# Patient Record
Sex: Male | Born: 1945 | Race: Black or African American | Hispanic: No | Marital: Married | Smoking: Former smoker
Health system: Southern US, Community
[De-identification: ages and names within clinical notes are randomized; demographics above are authoritative.]

## PROBLEM LIST (undated history)

## (undated) DIAGNOSIS — J40 Bronchitis, not specified as acute or chronic: Secondary | ICD-10-CM

## (undated) DIAGNOSIS — I509 Heart failure, unspecified: Secondary | ICD-10-CM

## (undated) DIAGNOSIS — I10 Essential (primary) hypertension: Secondary | ICD-10-CM

## (undated) DIAGNOSIS — E7800 Pure hypercholesterolemia, unspecified: Secondary | ICD-10-CM

## (undated) DIAGNOSIS — R0602 Shortness of breath: Secondary | ICD-10-CM

## (undated) DIAGNOSIS — J449 Chronic obstructive pulmonary disease, unspecified: Secondary | ICD-10-CM

## (undated) HISTORY — PX: NO PAST SURGERIES: SHX2092

---

## 2001-06-21 ENCOUNTER — Emergency Department (HOSPITAL_COMMUNITY): Admission: EM | Admit: 2001-06-21 | Discharge: 2001-06-21 | Payer: Self-pay | Admitting: Emergency Medicine

## 2012-06-09 ENCOUNTER — Emergency Department (HOSPITAL_COMMUNITY)
Admission: EM | Admit: 2012-06-09 | Discharge: 2012-06-09 | Disposition: A | Discharge status: Home / Self Care | Payer: 59 | Attending: Emergency Medicine | Admitting: Emergency Medicine

## 2012-06-09 ENCOUNTER — Emergency Department (HOSPITAL_COMMUNITY): Payer: 59

## 2012-06-09 ENCOUNTER — Encounter (HOSPITAL_COMMUNITY): Payer: Self-pay | Admitting: *Deleted

## 2012-06-09 DIAGNOSIS — I10 Essential (primary) hypertension: Secondary | ICD-10-CM | POA: Insufficient documentation

## 2012-06-09 DIAGNOSIS — R0989 Other specified symptoms and signs involving the circulatory and respiratory systems: Secondary | ICD-10-CM | POA: Insufficient documentation

## 2012-06-09 DIAGNOSIS — J3489 Other specified disorders of nose and nasal sinuses: Secondary | ICD-10-CM | POA: Insufficient documentation

## 2012-06-09 DIAGNOSIS — R059 Cough, unspecified: Secondary | ICD-10-CM | POA: Insufficient documentation

## 2012-06-09 DIAGNOSIS — R05 Cough: Secondary | ICD-10-CM | POA: Insufficient documentation

## 2012-06-09 DIAGNOSIS — Z8709 Personal history of other diseases of the respiratory system: Secondary | ICD-10-CM | POA: Insufficient documentation

## 2012-06-09 DIAGNOSIS — R0609 Other forms of dyspnea: Secondary | ICD-10-CM | POA: Insufficient documentation

## 2012-06-09 DIAGNOSIS — Z79899 Other long term (current) drug therapy: Secondary | ICD-10-CM | POA: Insufficient documentation

## 2012-06-09 DIAGNOSIS — R197 Diarrhea, unspecified: Secondary | ICD-10-CM | POA: Insufficient documentation

## 2012-06-09 DIAGNOSIS — J189 Pneumonia, unspecified organism: Secondary | ICD-10-CM

## 2012-06-09 DIAGNOSIS — F17200 Nicotine dependence, unspecified, uncomplicated: Secondary | ICD-10-CM | POA: Insufficient documentation

## 2012-06-09 HISTORY — DX: Essential (primary) hypertension: I10

## 2012-06-09 HISTORY — DX: Bronchitis, not specified as acute or chronic: J40

## 2012-06-09 LAB — BASIC METABOLIC PANEL
BUN: 8 mg/dL (ref 6–23)
Calcium: 8.6 mg/dL (ref 8.4–10.5)
GFR calc non Af Amer: >90 mL/min (ref >90)
Glucose, Bld: 86 mg/dL (ref 70–99)
Sodium: 140 mEq/L (ref 135–145)

## 2012-06-09 LAB — CBC WITH DIFFERENTIAL/PLATELET
Basophils Relative: 2 % — ABNORMAL HIGH (ref 0–1)
Eosinophils Absolute: 0 10*3/uL (ref 0.0–0.7)
Hemoglobin: 12.7 g/dL — ABNORMAL LOW (ref 13.0–17.0)
MCH: 26.8 pg (ref 26.0–34.0)
MCHC: 32.5 g/dL (ref 30.0–36.0)
Monocytes Absolute: 0.3 10*3/uL (ref 0.1–1.0)
Monocytes Relative: 7 % (ref 3–12)
Neutrophils Relative %: 58 % (ref 43–77)
RDW: 15 % (ref 11.5–15.5)

## 2012-06-09 MED ORDER — PREDNISONE 50 MG PO TABS
60.0000 mg | ORAL_TABLET | Freq: Once | ORAL | Status: AC
  Administered 2012-06-09: 60 mg via ORAL
  Filled 2012-06-09: qty 1

## 2012-06-09 MED ORDER — LEVOFLOXACIN 750 MG PO TABS: 750.0000 mg | ORAL_TABLET | Freq: Every day | ORAL | Status: DC

## 2012-06-09 MED ORDER — PREDNISONE 20 MG PO TABS: ORAL_TABLET | ORAL | Status: DC

## 2012-06-09 MED ORDER — ALBUTEROL SULFATE (5 MG/ML) 0.5% IN NEBU
5.0000 mg | INHALATION_SOLUTION | Freq: Once | RESPIRATORY_TRACT | Status: AC
  Administered 2012-06-09: 5 mg via RESPIRATORY_TRACT
  Filled 2012-06-09: qty 1

## 2012-06-09 MED ORDER — IPRATROPIUM BROMIDE 0.02 % IN SOLN
0.5000 mg | Freq: Once | RESPIRATORY_TRACT | Status: AC
  Administered 2012-06-09: 0.5 mg via RESPIRATORY_TRACT
  Filled 2012-06-09: qty 2.5

## 2012-06-09 NOTE — ED Provider Notes (Signed)
History   This chart was scribed for Ward Givens, MD by Gerlean Ren, ED Scribe. This patient was seen in room APA09/APA09 and the patient's care was started at 12:18 PM    CSN: [id number]  Arrival date & time 06/09/12  1140   First MD Initiated Contact with Patient 06/09/12 1152      Chief Complaint  Patient presents with  . Shortness of Breath    The history is provided by the patient. No language interpreter was used.  Bradley Bates is a 66 y.o. male who presents to the Emergency Department complaining of dyspnea that has been gradually worsening over the past week from baseline dyspnea and states current dyspnea is worst it has ever been.  Pt reports associated mild cough producing yellow phlegm, suspected fever, and occasional diarrhea normal to baseline.  Pt received a breathing treatment this morning at PCP and has had mild clear rhinorrhea since then.  Pt has inhaler that provides temporary relief to current dyspnea.  Pt denies sore throat, emesis, leg swelling.  Pt denies prior hospitalizations for breathing trouble Pt was seen by his PCP last week and started on levaquin on 1/24. He reports he isn't feeling better.   Pt is a current everyday smoker but denies alcohol use.   PCP [facility]  [facility] [place]  Past Medical History  Diagnosis Date  . Hypertension   . Bronchitis     History reviewed. No pertinent past surgical history.  No family history on file.  History  Substance Use Topics  . Smoking status: Current Every Day Smoker    Types: Cigarettes  . Smokeless tobacco: Not on file  . Alcohol Use: No  quit smoking 2 years ago but restarted 1 year ago.  Employed Lives at home Lives with spouse  Review of Systems  HENT: Negative for sore throat.   Respiratory: Positive for cough and shortness of breath.   Cardiovascular: Negative for leg swelling.  Gastrointestinal: Positive for diarrhea. Negative for vomiting.  All other systems reviewed and are  negative.    Allergies  Review of patient's allergies indicates no known allergies.  Home Medications   Current Outpatient Rx  Name  Route  Sig  Dispense  Refill  . ALBUTEROL SULFATE HFA 108 (90 BASE) MCG/ACT IN AERS   Inhalation   Inhale 2 puffs into the lungs every 6 (six) hours as needed. sob         . GUAIFENESIN 100 MG/5ML PO SYRP   Oral   Take 200 mg by mouth 3 (three) times daily as needed. Cough.         . IBUPROFEN 200 MG PO TABS   Oral   Take 200 mg by mouth every 6 (six) hours as needed. Pain         . LEVOFLOXACIN 750 MG PO TABS   Oral   Take 750 mg by mouth daily. 06/06/2012 x 5 days.         Marland Kitchen LEVOFLOXACIN 750 MG PO TABS   Oral   Take 1 tablet (750 mg total) by mouth daily.   5 tablet   0     BP 171/98  Pulse 90  Temp 97.9 F (36.6 C) (Oral)  Resp 20  Ht 5\' 10"  (1.778 m)  Wt 199 lb (90.266 kg)  BMI 28.55 kg/m2  SpO2 97%  Vital signs normal except hypertension   Physical Exam  Nursing note and vitals reviewed. Constitutional: He is oriented to person,  place, and time. He appears well-developed and well-nourished.  Non-toxic appearance. He does not appear ill. No distress.  HENT:  Head: Normocephalic and atraumatic.  Right Ear: External ear normal.  Left Ear: External ear normal.  Nose: Nose normal. No mucosal edema or rhinorrhea.  Mouth/Throat: Oropharynx is clear and moist and mucous membranes are normal. No dental abscesses or uvula swelling.  Eyes: Conjunctivae normal and EOM are normal. Pupils are equal, round, and reactive to light.  Neck: Normal range of motion and full passive range of motion without pain. Neck supple.  Cardiovascular: Normal rate, regular rhythm and normal heart sounds.  Exam reveals no gallop and no friction rub.   No murmur heard. Pulmonary/Chest: Effort normal. No respiratory distress. He has wheezes. He has no rhonchi. He has no rales. He exhibits no tenderness and no crepitus.       Wheezes with quiet  breathing, goes away with deep breathing.  Abdominal: Soft. Normal appearance and bowel sounds are normal. He exhibits no distension. There is no tenderness. There is no rebound and no guarding.  Musculoskeletal: Normal range of motion. He exhibits no edema and no tenderness.       Moves all extremities well.   Neurological: He is alert and oriented to person, place, and time. He has normal strength. No cranial nerve deficit.  Skin: Skin is warm, dry and intact. No rash noted. No erythema. No pallor.  Psychiatric: He has a normal mood and affect. His speech is normal and behavior is normal. His mood appears not anxious.    ED Course  Procedures (including critical care time)   Medications  albuterol (PROVENTIL HFA;VENTOLIN HFA) 108 (90 BASE) MCG/ACT inhaler (not administered)  predniSONE (DELTASONE) tablet 60 mg (60 mg Oral Given 06/09/12 1254)  albuterol (PROVENTIL) (5 MG/ML) 0.5% nebulizer solution 5 mg (5 mg Nebulization Given 06/09/12 1259)  ipratropium (ATROVENT) nebulizer solution 0.5 mg (0.5 mg Nebulization Given 06/09/12 1259)     DIAGNOSTIC STUDIES: Oxygen Saturation is 97% on room air, adequate by my interpretation.    COORDINATION OF CARE: 12:25 PM- Patient informed of clinical course, understands medical decision-making process, and agrees with plan.  1:55 PM- Re-check, pt states his breathing is improved and that he is ready to go home.  Informed pt that chest XR shows possible pneumonia. On exam has Improved air movement, rhonchi no longer present. States he has a new inhaler at home.   Pt ambulated with pulse ox which was 88-93 % on RA and 91% when he returned to his room. Denies feeling more SOB, CP.   14:35 D/W Dr Karilyn Cota, hospitalist, feels since he is asymptomatic with walking can go home and continue the levaquin for 10 days total and start on prednisone.    Results for orders placed during the hospital encounter of 06/09/12  CBC WITH DIFFERENTIAL      Component  Value Range   WBC 3.8 (*) 4.0 - 10.5 K/uL   RBC 4.73  4.22 - 5.81 MIL/uL   Hemoglobin 12.7 (*) 13.0 - 17.0 g/dL   HCT 16.1  09.6 - 04.5 %   MCV 82.7  78.0 - 100.0 fL   MCH 26.8  26.0 - 34.0 pg   MCHC 32.5  30.0 - 36.0 g/dL   RDW 40.9  81.1 - 91.4 %   Platelets 111 (*) 150 - 400 K/uL   Neutrophils Relative 58  43 - 77 %   Neutro Abs 2.2  1.7 - 7.7 K/uL  Lymphocytes Relative 32  12 - 46 %   Lymphs Abs 1.2  0.7 - 4.0 K/uL   Monocytes Relative 7  3 - 12 %   Monocytes Absolute 0.3  0.1 - 1.0 K/uL   Eosinophils Relative 1  0 - 5 %   Eosinophils Absolute 0.0  0.0 - 0.7 K/uL   Basophils Relative 2 (*) 0 - 1 %   Basophils Absolute 0.1  0.0 - 0.1 K/uL   WBC Morphology ATYPICAL LYMPHOCYTES    BASIC METABOLIC PANEL      Component Value Range   Sodium 140  135 - 145 mEq/L   Potassium 3.6  3.5 - 5.1 mEq/L   Chloride 100  96 - 112 mEq/L   CO2 32  19 - 32 mEq/L   Glucose, Bld 86  70 - 99 mg/dL   BUN 8  6 - 23 mg/dL   Creatinine, Ser 0.86  0.50 - 1.35 mg/dL   Calcium 8.6  8.4 - 57.8 mg/dL   GFR calc non Af Amer >90  >90 mL/min   GFR calc Af Amer >90  >90 mL/min    Laboratory interpretation all normal except low total WBC without neutropenia   Dg Chest 2 View  06/09/2012  *RADIOLOGY REPORT*  Clinical Data: Cough and shortness of breath  CHEST - 2 VIEW  Comparison: None.  Findings: Borderline enlarged cardiac silhouette and mediastinal contours.  Ill-defined heterogeneous interstitial opacities within the right mid and lower lung.  There is minimal thickening along the right minor fissure.  There is mild eventration of the bilateral hemidiaphragms.  No definite pleural effusion or pneumothorax.  No acute osseous abnormalities.  IMPRESSION: 1.  Ill-defined interstitial opacity within the right mid and lower lung worrisome for multifocal infection, including atypical etiologies.  A follow-up chest radiograph in 4 to 6 weeks after treatment is recommended to ensure resolution.  2.  Borderline  enlarged cardiac silhouette.  Further evaluation with cardiac echo may be performed as clinically indicated.   Original Report Authenticated By: Tacey Ruiz, MD      1. CAP (community acquired pneumonia)    New Prescriptions   LEVOFLOXACIN (LEVAQUIN) 750 MG TABLET    Take 1 tablet (750 mg total) by mouth daily.   PREDNISONE (DELTASONE) 20 MG TABLET    Take 3 po QD x 2d starting tomorrow, then 2 po QD x 3d then 1 po QD x 3d   Plan discharge  Devoria Albe, MD, FACEP    MDM   I personally performed the services described in this documentation, which was scribed in my presence. The recorded information has been reviewed and considered.  Devoria Albe, MD, Armando Gang        Ward Givens, MD 06/09/12 [phone]

## 2012-06-09 NOTE — ED Notes (Signed)
Increased sob x 1 wk.  Seen pcp last week, given abx.  Went back to pcp today with no relief.  PCP sent pt here.  C/o cough, difficulty breathing, worsening when lying flat.

## 2012-06-09 NOTE — ED Notes (Signed)
Pt pulse ox 88%-93% while ambulating around nurses's station. 91% upon return to room. Denies cp/sob/fatigue.

## 2012-12-15 ENCOUNTER — Emergency Department (HOSPITAL_COMMUNITY): Payer: 59

## 2012-12-15 ENCOUNTER — Encounter (HOSPITAL_COMMUNITY): Payer: Self-pay | Admitting: *Deleted

## 2012-12-15 ENCOUNTER — Inpatient Hospital Stay (HOSPITAL_COMMUNITY)
Admission: EM | Admit: 2012-12-15 | Discharge: 2012-12-19 | DRG: 291 | Disposition: A | Discharge status: Home Health | Payer: 59 | Attending: Family Medicine | Admitting: Family Medicine

## 2012-12-15 ENCOUNTER — Other Ambulatory Visit: Payer: Self-pay

## 2012-12-15 DIAGNOSIS — Z8249 Family history of ischemic heart disease and other diseases of the circulatory system: Secondary | ICD-10-CM

## 2012-12-15 DIAGNOSIS — D638 Anemia in other chronic diseases classified elsewhere: Secondary | ICD-10-CM | POA: Diagnosis present

## 2012-12-15 DIAGNOSIS — I5021 Acute systolic (congestive) heart failure: Principal | ICD-10-CM | POA: Diagnosis present

## 2012-12-15 DIAGNOSIS — D696 Thrombocytopenia, unspecified: Secondary | ICD-10-CM | POA: Diagnosis present

## 2012-12-15 DIAGNOSIS — Z833 Family history of diabetes mellitus: Secondary | ICD-10-CM

## 2012-12-15 DIAGNOSIS — I509 Heart failure, unspecified: Secondary | ICD-10-CM | POA: Diagnosis present

## 2012-12-15 DIAGNOSIS — D649 Anemia, unspecified: Secondary | ICD-10-CM | POA: Diagnosis present

## 2012-12-15 DIAGNOSIS — R7402 Elevation of levels of lactic acid dehydrogenase (LDH): Secondary | ICD-10-CM | POA: Diagnosis present

## 2012-12-15 DIAGNOSIS — J811 Chronic pulmonary edema: Secondary | ICD-10-CM

## 2012-12-15 DIAGNOSIS — K5900 Constipation, unspecified: Secondary | ICD-10-CM | POA: Diagnosis present

## 2012-12-15 DIAGNOSIS — F17200 Nicotine dependence, unspecified, uncomplicated: Secondary | ICD-10-CM | POA: Diagnosis present

## 2012-12-15 DIAGNOSIS — J9600 Acute respiratory failure, unspecified whether with hypoxia or hypercapnia: Secondary | ICD-10-CM | POA: Diagnosis present

## 2012-12-15 DIAGNOSIS — Z803 Family history of malignant neoplasm of breast: Secondary | ICD-10-CM

## 2012-12-15 DIAGNOSIS — Z9119 Patient's noncompliance with other medical treatment and regimen: Secondary | ICD-10-CM

## 2012-12-15 DIAGNOSIS — Z801 Family history of malignant neoplasm of trachea, bronchus and lung: Secondary | ICD-10-CM

## 2012-12-15 DIAGNOSIS — J40 Bronchitis, not specified as acute or chronic: Secondary | ICD-10-CM

## 2012-12-15 DIAGNOSIS — I10 Essential (primary) hypertension: Secondary | ICD-10-CM | POA: Diagnosis present

## 2012-12-15 DIAGNOSIS — Z720 Tobacco use: Secondary | ICD-10-CM | POA: Diagnosis present

## 2012-12-15 DIAGNOSIS — I059 Rheumatic mitral valve disease, unspecified: Secondary | ICD-10-CM

## 2012-12-15 DIAGNOSIS — J42 Unspecified chronic bronchitis: Secondary | ICD-10-CM | POA: Diagnosis present

## 2012-12-15 DIAGNOSIS — Z91199 Patient's noncompliance with other medical treatment and regimen due to unspecified reason: Secondary | ICD-10-CM

## 2012-12-15 DIAGNOSIS — R7401 Elevation of levels of liver transaminase levels: Secondary | ICD-10-CM | POA: Diagnosis present

## 2012-12-15 DIAGNOSIS — R51 Headache: Secondary | ICD-10-CM | POA: Diagnosis present

## 2012-12-15 LAB — TROPONIN I: Troponin I: <0.3 ng/mL (ref <0.30)

## 2012-12-15 LAB — URINALYSIS, ROUTINE W REFLEX MICROSCOPIC
Leukocytes, UA: NEGATIVE
Specific Gravity, Urine: 1.025 (ref 1.005–1.030)
Urobilinogen, UA: 0.2 mg/dL (ref 0.0–1.0)

## 2012-12-15 LAB — CBC WITH DIFFERENTIAL/PLATELET
Basophils Relative: 1 % (ref 0–1)
Eosinophils Absolute: 0.1 10*3/uL (ref 0.0–0.7)
MCH: 26.8 pg (ref 26.0–34.0)
MCHC: 32 g/dL (ref 30.0–36.0)
Neutrophils Relative %: 58 % (ref 43–77)
Platelets: 141 10*3/uL — ABNORMAL LOW (ref 150–400)

## 2012-12-15 LAB — COMPREHENSIVE METABOLIC PANEL
ALT: 72 U/L — ABNORMAL HIGH (ref 0–53)
Albumin: 3.3 g/dL — ABNORMAL LOW (ref 3.5–5.2)
Alkaline Phosphatase: 58 U/L (ref 39–117)
BUN: 14 mg/dL (ref 6–23)
Potassium: 3.8 mEq/L (ref 3.5–5.1)
Sodium: 142 mEq/L (ref 135–145)
Total Protein: 6.4 g/dL (ref 6.0–8.3)

## 2012-12-15 LAB — BASIC METABOLIC PANEL
BUN: 13 mg/dL (ref 6–23)
GFR calc non Af Amer: 72 mL/min — ABNORMAL LOW (ref >90)
Glucose, Bld: 147 mg/dL — ABNORMAL HIGH (ref 70–99)
Potassium: 3.9 mEq/L (ref 3.5–5.1)

## 2012-12-15 LAB — URINE MICROSCOPIC-ADD ON

## 2012-12-15 MED ORDER — LIVING BETTER WITH HEART FAILURE BOOK
Freq: Once | Status: AC
  Administered 2012-12-16: 12:00:00
  Filled 2012-12-15: qty 1

## 2012-12-15 MED ORDER — FUROSEMIDE 10 MG/ML IJ SOLN
40.0000 mg | Freq: Two times a day (BID) | INTRAMUSCULAR | Status: DC
  Administered 2012-12-16 – 2012-12-17 (×3): 40 mg via INTRAVENOUS
  Filled 2012-12-15 (×3): qty 4

## 2012-12-15 MED ORDER — NITROGLYCERIN IN D5W 200-5 MCG/ML-% IV SOLN
5.0000 ug/min | Freq: Once | INTRAVENOUS | Status: AC
  Administered 2012-12-15: 5 ug/min via INTRAVENOUS
  Filled 2012-12-15: qty 250

## 2012-12-15 MED ORDER — MORPHINE SULFATE 2 MG/ML IJ SOLN: 1.0000 mg | INTRAMUSCULAR | Status: DC | PRN

## 2012-12-15 MED ORDER — METOPROLOL TARTRATE 25 MG PO TABS
25.0000 mg | ORAL_TABLET | Freq: Two times a day (BID) | ORAL | Status: DC
  Administered 2012-12-15 – 2012-12-17 (×4): 25 mg via ORAL
  Filled 2012-12-15 (×4): qty 1

## 2012-12-15 MED ORDER — AMLODIPINE BESYLATE 5 MG PO TABS
10.0000 mg | ORAL_TABLET | Freq: Every day | ORAL | Status: DC
  Administered 2012-12-16 – 2012-12-19 (×4): 10 mg via ORAL
  Filled 2012-12-15 (×4): qty 2

## 2012-12-15 MED ORDER — SODIUM CHLORIDE 0.9 % IJ SOLN
3.0000 mL | Freq: Two times a day (BID) | INTRAMUSCULAR | Status: DC
  Administered 2012-12-16 – 2012-12-19 (×6): 3 mL via INTRAVENOUS

## 2012-12-15 MED ORDER — ONDANSETRON HCL 4 MG/2ML IJ SOLN: 4.0000 mg | Freq: Three times a day (TID) | INTRAMUSCULAR | Status: AC | PRN

## 2012-12-15 MED ORDER — ENOXAPARIN SODIUM 40 MG/0.4ML ~~LOC~~ SOLN
40.0000 mg | SUBCUTANEOUS | Status: DC
  Administered 2012-12-15 – 2012-12-19 (×5): 40 mg via SUBCUTANEOUS
  Filled 2012-12-15 (×5): qty 0.4

## 2012-12-15 MED ORDER — TRAZODONE HCL 50 MG PO TABS: 25.0000 mg | ORAL_TABLET | Freq: Every evening | ORAL | Status: DC | PRN

## 2012-12-15 MED ORDER — SENNA 8.6 MG PO TABS
1.0000 | ORAL_TABLET | Freq: Two times a day (BID) | ORAL | Status: DC
  Administered 2012-12-16 – 2012-12-19 (×7): 8.6 mg via ORAL
  Filled 2012-12-15 (×9): qty 1

## 2012-12-15 MED ORDER — AMLODIPINE BESYLATE 5 MG PO TABS
5.0000 mg | ORAL_TABLET | Freq: Once | ORAL | Status: AC
  Administered 2012-12-15: 5 mg via ORAL
  Filled 2012-12-15: qty 1

## 2012-12-15 MED ORDER — LABETALOL HCL 5 MG/ML IV SOLN: 5.0000 mg | INTRAVENOUS | Status: DC | PRN

## 2012-12-15 MED ORDER — ALBUTEROL SULFATE (5 MG/ML) 0.5% IN NEBU
2.5000 mg | INHALATION_SOLUTION | Freq: Four times a day (QID) | RESPIRATORY_TRACT | Status: DC
  Administered 2012-12-15 – 2012-12-19 (×14): 2.5 mg via RESPIRATORY_TRACT
  Filled 2012-12-15 (×15): qty 0.5

## 2012-12-15 MED ORDER — SODIUM CHLORIDE 0.9 % IJ SOLN: 3.0000 mL | INTRAMUSCULAR | Status: DC | PRN

## 2012-12-15 MED ORDER — ACETAMINOPHEN 650 MG RE SUPP: 650.0000 mg | Freq: Four times a day (QID) | RECTAL | Status: DC | PRN

## 2012-12-15 MED ORDER — ACETAMINOPHEN 325 MG PO TABS
650.0000 mg | ORAL_TABLET | Freq: Four times a day (QID) | ORAL | Status: DC | PRN
  Administered 2012-12-16: 650 mg via ORAL
  Filled 2012-12-15: qty 2

## 2012-12-15 MED ORDER — FUROSEMIDE 10 MG/ML IJ SOLN
40.0000 mg | Freq: Once | INTRAMUSCULAR | Status: AC
  Administered 2012-12-15: 40 mg via INTRAVENOUS
  Filled 2012-12-15: qty 4

## 2012-12-15 MED ORDER — ASPIRIN 81 MG PO CHEW
324.0000 mg | CHEWABLE_TABLET | Freq: Once | ORAL | Status: AC
  Administered 2012-12-15: 324 mg via ORAL
  Filled 2012-12-15: qty 4

## 2012-12-15 MED ORDER — LISINOPRIL 10 MG PO TABS
20.0000 mg | ORAL_TABLET | Freq: Every day | ORAL | Status: DC
  Administered 2012-12-15 – 2012-12-16 (×2): 20 mg via ORAL
  Filled 2012-12-15 (×2): qty 2

## 2012-12-15 MED ORDER — ALUM & MAG HYDROXIDE-SIMETH 200-200-20 MG/5ML PO SUSP: 30.0000 mL | Freq: Four times a day (QID) | ORAL | Status: DC | PRN

## 2012-12-15 MED ORDER — AMLODIPINE BESYLATE 5 MG PO TABS
2.5000 mg | ORAL_TABLET | Freq: Two times a day (BID) | ORAL | Status: DC
  Administered 2012-12-15: 2.5 mg via ORAL
  Filled 2012-12-15: qty 1

## 2012-12-15 MED ORDER — FLEET ENEMA 7-19 GM/118ML RE ENEM: 1.0000 | ENEMA | Freq: Once | RECTAL | Status: AC | PRN

## 2012-12-15 MED ORDER — MAGNESIUM HYDROXIDE 400 MG/5ML PO SUSP: 30.0000 mL | Freq: Every day | ORAL | Status: DC | PRN

## 2012-12-15 MED ORDER — GUAIFENESIN-DM 100-10 MG/5ML PO SYRP: 5.0000 mL | ORAL_SOLUTION | ORAL | Status: DC | PRN

## 2012-12-15 MED ORDER — HYDROCODONE-ACETAMINOPHEN 5-325 MG PO TABS: 1.0000 | ORAL_TABLET | ORAL | Status: DC | PRN

## 2012-12-15 MED ORDER — SODIUM CHLORIDE 0.9 % IV SOLN: 250.0000 mL | INTRAVENOUS | Status: DC | PRN

## 2012-12-15 MED ORDER — HYDRALAZINE HCL 25 MG PO TABS
25.0000 mg | ORAL_TABLET | Freq: Three times a day (TID) | ORAL | Status: DC
  Administered 2012-12-15 – 2012-12-19 (×12): 25 mg via ORAL
  Filled 2012-12-15 (×12): qty 1

## 2012-12-15 MED ORDER — ONDANSETRON HCL 4 MG PO TABS: 4.0000 mg | ORAL_TABLET | Freq: Four times a day (QID) | ORAL | Status: DC | PRN

## 2012-12-15 MED ORDER — ONDANSETRON HCL 4 MG/2ML IJ SOLN: 4.0000 mg | Freq: Four times a day (QID) | INTRAMUSCULAR | Status: DC | PRN

## 2012-12-15 NOTE — Consult Note (Signed)
Patient ID: Bradley Bates MRN: [id number], DOB/AGE: [birth year]   Admit date: 12/15/2012 Date of Consult: @TODAY @  Primary Physician: No PCP Per Patient Primary Cardiologist: New   Problem List: Past Medical History  Diagnosis Date  . Hypertension   . Bronchitis     History reviewed. No pertinent past surgical history.   Allergies: No Known Allergies  HPI:  Patient is a 67 yo with Hx of HTN  Presents with 3 day history of progressive SOB  The patient has nevere had heart problems that he knows of   Denies CP  Says since Friday it has been harder for him to breathe.  Gives out with acitvity  Cannot lay flat.  Does note LE edema.  Admits to not taking BP meds like he should.   Denies dizziness.  No palpitations.    1 month ago he says he felt OK   Inpatient Medications:  . albuterol  2.5 mg Nebulization Q6H  . enoxaparin (LOVENOX) injection  40 mg Subcutaneous Q24H  . furosemide  40 mg Intravenous Once  . [START ON 12/16/2012] furosemide  40 mg Intravenous BID  . lisinopril  20 mg Oral Daily  . senna  1 tablet Oral BID  . sodium chloride  3 mL Intravenous Q12H    Family History  Problem Relation Age of Onset  . Diabetes Brother   . Lung cancer Mother   . Cancer Brother      History   Social History  . Marital Status: Married    Spouse Name: N/A    Number of Children: N/A  . Years of Education: N/A   Occupational History  . Not on file.   Social History Main Topics  . Smoking status: Current Every Day Smoker -- 1.00 packs/day for 35 years    Types: Cigarettes  . Smokeless tobacco: Never Used  . Alcohol Use: No  . Drug Use: No  . Sexually Active: Yes   Other Topics Concern  . Not on file   Social History Narrative  . No narrative on file     Review of Systems: All other systems reviewed and are otherwise negative except as noted above.  Physical Exam: Filed Vitals:   12/15/12 1700  BP: 199/101  Pulse:   Temp:   Resp: 26    Intake/Output Summary  (Last 24 hours) at 12/15/12 1802 Last data filed at 12/15/12 1700  Gross per 24 hour  Intake    590 ml  Output   1150 ml  Net   -560 ml    General: Well developed, well nourished, in no acute distress. Head: Normocephalic, atraumatic, sclera non-icteric Neck: Negative for carotid bruits. JVP not increased Lungs: Decreased airflow bilaterally  Mold rales at bases  Rhonchi Heart: RRR with S1 S2. Gr II/VI systolic murmur LSB to base Abdomen: Soft, non-tender, non-distended with normoactive bowel sounds. No hepatomegaly. No rebound/guarding. No obvious abdominal masses. Msk:  Strength and tone appears normal for age. Extremities: No clubbing, cyanosis 1+edema.  Distal pedal pulses are 2+ and equal bilaterally. Neuro: Alert and oriented X 3. Moves all extremities spontaneously. Psych:  Responds to questions appropriately with a normal affect.  Labs: Results for orders placed during the hospital encounter of 12/15/12 (from the past 24 hour(s))  CBC WITH DIFFERENTIAL     Status: Abnormal   Collection Time    12/15/12 10:27 AM      Result Value Range   WBC 4.0  4.0 - 10.5 K/uL  RBC 4.62  4.22 - 5.81 MIL/uL   Hemoglobin 12.4 (*) 13.0 - 17.0 g/dL   HCT 16.1 (*) 09.6 - 04.5 %   MCV 84.0  78.0 - 100.0 fL   MCH 26.8  26.0 - 34.0 pg   MCHC 32.0  30.0 - 36.0 g/dL   RDW 40.9  81.1 - 91.4 %   Platelets 141 (*) 150 - 400 K/uL   Neutrophils Relative % 58  43 - 77 %   Neutro Abs 2.3  1.7 - 7.7 K/uL   Lymphocytes Relative 31  12 - 46 %   Lymphs Abs 1.2  0.7 - 4.0 K/uL   Monocytes Relative 10  3 - 12 %   Monocytes Absolute 0.4  0.1 - 1.0 K/uL   Eosinophils Relative 1  0 - 5 %   Eosinophils Absolute 0.1  0.0 - 0.7 K/uL   Basophils Relative 1  0 - 1 %   Basophils Absolute 0.0  0.0 - 0.1 K/uL  COMPREHENSIVE METABOLIC PANEL     Status: Abnormal   Collection Time    12/15/12 10:27 AM      Result Value Range   Sodium 142  135 - 145 mEq/L   Potassium 3.8  3.5 - 5.1 mEq/L   Chloride 105  96 -  112 mEq/L   CO2 31  19 - 32 mEq/L   Glucose, Bld 99  70 - 99 mg/dL   BUN 14  6 - 23 mg/dL   Creatinine, Ser 7.82  0.50 - 1.35 mg/dL   Calcium 8.5  8.4 - 95.6 mg/dL   Total Protein 6.4  6.0 - 8.3 g/dL   Albumin 3.3 (*) 3.5 - 5.2 g/dL   AST 58 (*) 0 - 37 U/L   ALT 72 (*) 0 - 53 U/L   Alkaline Phosphatase 58  39 - 117 U/L   Total Bilirubin 0.4  0.3 - 1.2 mg/dL   GFR calc non Af Amer 86 (*) >90 mL/min   GFR calc Af Amer >90  >90 mL/min  TROPONIN I     Status: None   Collection Time    12/15/12 10:27 AM      Result Value Range   Troponin I <0.30  <0.30 ng/mL  PRO B NATRIURETIC PEPTIDE     Status: Abnormal   Collection Time    12/15/12 10:27 AM      Result Value Range   Pro B Natriuretic peptide (BNP) 8443.0 (*) 0 - 125 pg/mL  URINALYSIS, ROUTINE W REFLEX MICROSCOPIC     Status: Abnormal   Collection Time    12/15/12 10:51 AM      Result Value Range   Color, Urine YELLOW  YELLOW   APPearance CLEAR  CLEAR   Specific Gravity, Urine 1.025  1.005 - 1.030   pH 5.5  5.0 - 8.0   Glucose, UA NEGATIVE  NEGATIVE mg/dL   Hgb urine dipstick TRACE (*) NEGATIVE   Bilirubin Urine NEGATIVE  NEGATIVE   Ketones, ur NEGATIVE  NEGATIVE mg/dL   Protein, ur 30 (*) NEGATIVE mg/dL   Urobilinogen, UA 0.2  0.0 - 1.0 mg/dL   Nitrite NEGATIVE  NEGATIVE   Leukocytes, UA NEGATIVE  NEGATIVE  URINE MICROSCOPIC-ADD ON     Status: None   Collection Time    12/15/12 10:51 AM      Result Value Range   Squamous Epithelial / LPF RARE  RARE   WBC, UA 0-2  <3 WBC/hpf   RBC /  HPF 0-2  <3 RBC/hpf   Bacteria, UA RARE  RARE  TROPONIN I     Status: None   Collection Time    12/15/12  3:41 PM      Result Value Range   Troponin I <0.30  <0.30 ng/mL  BASIC METABOLIC PANEL     Status: Abnormal   Collection Time    12/15/12  3:41 PM      Result Value Range   Sodium 143  135 - 145 mEq/L   Potassium 3.9  3.5 - 5.1 mEq/L   Chloride 103  96 - 112 mEq/L   CO2 34 (*) 19 - 32 mEq/L   Glucose, Bld 147 (*) 70 - 99 mg/dL    BUN 13  6 - 23 mg/dL   Creatinine, Ser 1.61  0.50 - 1.35 mg/dL   Calcium 8.7  8.4 - 09.6 mg/dL   GFR calc non Af Amer 72 (*) >90 mL/min   GFR calc Af Amer 84 (*) >90 mL/min    Radiology/Studies: Dg Chest Portable 1 View  12/15/2012   *RADIOLOGY REPORT*  Clinical Data: CHF with shortness of breath and hypertension  PORTABLE CHEST - 1 VIEW  Comparison: Earlier the same day  Findings: 1029 hours. The cardiopericardial silhouette is enlarged. A pulmonary vascular congestion persists with some bibasilar alveolar opacity. Telemetry leads overlie the chest. Imaged bony structures of the thorax are intact.  IMPRESSION: Cardiomegaly with vascular congestion and basilar alveolar densities, likely reflecting edema.   Original Report Authenticated By: Kennith Center, M.D.    EKG:  SR 83  Occasional PVC  LVH  ASSESSMENT AND PLAN:  Patient is a 67 yo with history of HTN  PResents in CHF  On exam has increased volume  BP is still high.   Patient has received lisinopril 20  I would stop labetalol and start low dose metoprolol.   On IV NTG  Would add amlodipine tonight.  May back off as BP improves but should help with diuresis to get down  Diurese with lasix  Watch Cr  Echo to evaluate LV size/function   2.  HTN  As above  3.  HCM  Counselled on tobacco cessation.  Patient had quit for 1 1/2 years in pst.   Check lipid in AM.  Signed, Dietrich Pates 12/15/2012, 6:02 PM

## 2012-12-15 NOTE — Progress Notes (Signed)
*  PRELIMINARY RESULTS* Echocardiogram 2D Echocardiogram has been performed.  Bradley Bates Jane 12/15/2012, 4:48 PM 

## 2012-12-15 NOTE — Progress Notes (Signed)
Nutrition Education Note  RD consulted for nutrition education regarding a Low Sodium diet.   Lipid Panel  No results found for this basename: chol, trig, hdl, cholhdl, vldl, ldlcalc    RD provided "Low Sodium Nutrition Therapy" handout from the Academy of [employer]. Reviewed patient's dietary recall. Provided examples on ways to decrease sodium and fat intake in diet. Discouraged intake of processed foods and use of salt shaker. Encouraged fresh fruits and vegetables as well as whole grain sources of carbohydrates to maximize fiber intake. Teach back method used.  Expect fair compliance.  Body mass index is 31.57 kg/(m^2). Pt meets criteria for obesity class I based on current BMI.  Current diet order is Heart Healthy, patient is consuming approximately 90% of meals at this time. Labs and medications reviewed. No further nutrition interventions warranted at this time. RD contact information provided. If additional nutrition issues arise, please re-consult RD.  Royann Shivers MS,RD,LDN,CSG Office: [phone] Pager: [phone]

## 2012-12-15 NOTE — ED Notes (Addendum)
Pt sent by [facility], pt has gained 20 lbs in a month and has had increased SOB with rales. Pt was medicated with nitro x3, 1 inch nitro paste, 20g IV, pt's O2 sats 81% RA. O2 2L applied at triage 2l [place] sats corrected to 93%.

## 2012-12-15 NOTE — ED Notes (Signed)
CFM sent pt's records as well as xray and previous EKGs

## 2012-12-15 NOTE — Plan of Care (Signed)
Problem: Consults Goal: Nutrition Consult-if indicated Outcome: Progressing Education on low sodium diet, patient eats regular diet at home

## 2012-12-15 NOTE — ED Provider Notes (Signed)
CSN: [id number]     Arrival date & time 12/15/12  1008 History  This chart was scribed for Bradley Octave, MD by [name], ED Scribe. This patient was seen in room [facility]14[facility]14 and the patient's care was started at 10:14 AM.   First MD Initiated Contact with Patient 12/15/12 1000     Chief Complaint  Patient presents with  . Congestive Heart Failure  . Shortness of Breath  . Hypertension    The history is provided by the patient. No language interpreter was used.    HPI Comments: Bradley Bates is a 67 y.o. male who presents to the Emergency Department complaining of 3 days of gradual onset, gradually worsening dyspnea with an associated 20 lbs weight gain and bilateral leg swelling  in the past month. He reports that the dyspnea is aggravated with laying down and exertion and is improved with sitting up. He admits that he wakes up at night "gasping" for air at times. He denies having abdominal pain but reports a tightness. O2 was applied in triage after O2 sat on RA was measured at 81%. He denies being on O2 at home. He was seen at [facility] today for his symptoms and sent to the ED. He was given 3 NTG and 1 inch nitro paste in the office. He denies CP currently. He denies having any known cardiac disease, prior MI or prior CHF diagnoses. He denies having a h/o HTN but denies DM. Pt is a current everyday smoker but denies alcohol use.   Past Medical History  Diagnosis Date  . Hypertension   . Bronchitis    History reviewed. No pertinent past surgical history. History reviewed. No pertinent family history. History  Substance Use Topics  . Smoking status: Current Every Day Smoker -- 1.00 packs/day    Types: Cigarettes  . Smokeless tobacco: Not on file  . Alcohol Use: No    Review of Systems  A complete 10 system review of systems was obtained and all systems are negative except as noted in the HPI and PMH.   Allergies  Review of patient's allergies indicates no known  allergies.  Home Medications   No current outpatient prescriptions on file. Triage Vitals: BP 196/98  Pulse 84  Temp(Src) 98.2 F (36.8 C) (Oral)  Resp 21  Ht 5\' 10"  (1.778 m)  Wt 220 lb (99.791 kg)  BMI 31.57 kg/m2  SpO2 81%  Physical Exam  Nursing note and vitals reviewed. Constitutional: He is oriented to person, place, and time. He appears well-developed and well-nourished. No distress.  On [place]  HENT:  Head: Normocephalic and atraumatic.  Eyes: Conjunctivae and EOM are normal.  Neck: Normal range of motion. Neck supple. No tracheal deviation present.  Cardiovascular: Normal rate, regular rhythm and normal heart sounds.   No murmur heard. Pulmonary/Chest: Effort normal. No respiratory distress. He has no wheezes. He has rales.  crackles half way up the lung fields  Abdominal: Soft. Bowel sounds are normal. There is no tenderness.  Musculoskeletal: Normal range of motion. He exhibits edema (+3 edema to mid tibia bilaterally).  Neurological: He is alert and oriented to person, place, and time. No cranial nerve deficit.  Skin: Skin is warm and dry.  Psychiatric: He has a normal mood and affect. His behavior is normal.    ED Course   Procedures (including critical care time)  Medications  ondansetron (ZOFRAN) injection 4 mg (not administered)  albuterol (PROVENTIL) (5 MG/ML) 0.5% nebulizer solution 2.5 mg (not  administered)  enoxaparin (LOVENOX) injection 40 mg (not administered)  sodium chloride 0.9 % injection 3 mL (not administered)  sodium chloride 0.9 % injection 3 mL (not administered)  0.9 %  sodium chloride infusion (not administered)  acetaminophen (TYLENOL) tablet 650 mg (not administered)    Or  acetaminophen (TYLENOL) suppository 650 mg (not administered)  HYDROcodone-acetaminophen (NORCO/VICODIN) 5-325 MG per tablet 1-2 tablet (not administered)  morphine 2 MG/ML injection 1 mg (not administered)  traZODone (DESYREL) tablet 25 mg (not administered)  senna  (SENOKOT) tablet 8.6 mg (not administered)  magnesium hydroxide (MILK OF MAGNESIA) suspension 30 mL (not administered)  sodium phosphate (FLEET) 7-19 GM/118ML enema 1 enema (not administered)  ondansetron (ZOFRAN) tablet 4 mg (not administered)    Or  ondansetron (ZOFRAN) injection 4 mg (not administered)  alum & mag hydroxide-simeth (MAALOX/MYLANTA) 200-200-20 MG/5ML suspension 30 mL (not administered)  guaiFENesin-dextromethorphan (ROBITUSSIN DM) 100-10 MG/5ML syrup 5 mL (not administered)  lisinopril (PRINIVIL,ZESTRIL) tablet 20 mg (not administered)  furosemide (LASIX) injection 40 mg (not administered)  furosemide (LASIX) injection 40 mg (not administered)  labetalol (NORMODYNE,TRANDATE) injection 5 mg (not administered)  aspirin chewable tablet 324 mg (324 mg Oral Given 12/15/12 1029)  furosemide (LASIX) injection 40 mg (40 mg Intravenous Given 12/15/12 1029)  nitroGLYCERIN 0.2 mg/mL in dextrose 5 % infusion (5 mcg/min Intravenous New Bag/Given 12/15/12 1106)    DIAGNOSTIC STUDIES: Oxygen Saturation is 98% on 2L [place], normal by my interpretation.    COORDINATION OF CARE: 10:19 AM-Informed pt that I believe his symptoms could be CHF. Discussed treatment plan which includes lasix to improve extra fluid, CXR, CBC panel, CMP, UA, troponin and BNP with pt at bedside and pt agreed to plan. Advised pt that admission is likely and pt agreed. 10:21 AM-  EMERGENCY DEPARTMENT [place] CARDIAC EXAM "Study: Limited Ultrasound of the heart and pericardium"  INDICATIONS:Dyspnea Multiple views of the heart and pericardium are obtained with a multi-frequency probe.  PERFORMED ZO:XWRUEA  IMAGES ARCHIVED?: No  FINDINGS: No pericardial effusion, Decreased contractility and Tamponade physiology absent  LIMITATIONS:  Body habitus  VIEWS USED: Subcostal 4 chamber and Apical 4 chamber   INTERPRETATION: Cardiac activity present, Pericardial effusioin absent, Cardiac tamponade absent and Decreased  contractility  COMMENT:    11:13 AM-Pt rechecked and reports that his SOB has improved. Per nurse pt has urinated out 250 mL. Informed pt of labs and radiology suggesting CHF. Re-discussed admission with pt and pt agreed. BP is 208/108.  11:40 AM-Consult complete with Dr. Elvera Lennox, hospitalist. Patient case explained and discussed. Dr. Elvera Lennox agrees to admit patient for further evaluation and treatment to [facility] ICU. Call ended at 11:41 AM.  Labs Reviewed  CBC WITH DIFFERENTIAL - Abnormal; Notable for the following:    Hemoglobin 12.4 (*)    HCT 38.8 (*)    Platelets 141 (*)    All other components within normal limits  COMPREHENSIVE METABOLIC PANEL - Abnormal; Notable for the following:    Albumin 3.3 (*)    AST 58 (*)    ALT 72 (*)    GFR calc non Af Amer 86 (*)    All other components within normal limits  PRO B NATRIURETIC PEPTIDE - Abnormal; Notable for the following:    Pro B Natriuretic peptide (BNP) 8443.0 (*)    All other components within normal limits  URINALYSIS, ROUTINE W REFLEX MICROSCOPIC - Abnormal; Notable for the following:    Hgb urine dipstick TRACE (*)    Protein, ur 30 (*)  All other components within normal limits  MRSA PCR SCREENING  TROPONIN I  URINE MICROSCOPIC-ADD ON  CBC  CREATININE, SERUM  TROPONIN I  TROPONIN I  BASIC METABOLIC PANEL   Dg Chest Portable 1 View  12/15/2012   *RADIOLOGY REPORT*  Clinical Data: CHF with shortness of breath and hypertension  PORTABLE CHEST - 1 VIEW  Comparison: Earlier the same day  Findings: 1029 hours. The cardiopericardial silhouette is enlarged. A pulmonary vascular congestion persists with some bibasilar alveolar opacity. Telemetry leads overlie the chest. Imaged bony structures of the thorax are intact.  IMPRESSION: Cardiomegaly with vascular congestion and basilar alveolar densities, likely reflecting edema.   Original Report Authenticated By: Kennith Center, M.D.   1. Pulmonary edema   2. Acute exacerbation of CHF  (congestive heart failure)   3. Bronchitis     MDM  3 day history of shortness of breath, progressive weight gain, peripheral edema. No chest pain. From MDs office with hypoxia and hypertension. Poorly compliant with BP meds.  History and exam consistent with CHF, likely new onset. Patient given aspirin, Lasix, nitroglycerin.  Chest x-ray shows pulmonary edema. Continue IV nitroglycerin for pulmonary edema/hypertensive emergency. Non specific EKG changes on EKG, patient denies chest pain, troponin negative. BP down to 180/100. Good response to IV lasix.  Admit to stepdown d/w Dr. Elvera Lennox.    Date: 12/15/2012  Rate: 87  Rhythm: normal sinus rhythm  QRS Axis: left  Intervals: normal  ST/T Wave abnormalities: nonspecific ST/T changes  Conduction Disutrbances:none  Narrative Interpretation: LVH  Old EKG Reviewed: none available   CRITICAL CARE Performed by: Bradley Bates Total critical care time: 30 Critical care time was exclusive of separately billable procedures and treating other patients. Critical care was necessary to treat or prevent imminent or life-threatening deterioration. Critical care was time spent personally by me on the following activities: development of treatment plan with patient and/or surrogate as well as nursing, discussions with consultants, evaluation of patient's response to treatment, examination of patient, obtaining history from patient or surrogate, ordering and performing treatments and interventions, ordering and review of laboratory studies, ordering and review of radiographic studies, pulse oximetry and re-evaluation of patient's condition.  I personally performed the services described in this documentation, which was scribed in my presence. The recorded information has been reviewed and is accurate.    Bradley Octave, MD 12/15/12 1332

## 2012-12-15 NOTE — ED Notes (Signed)
Will reassess in 15 minutes for response to lasix, if no improvement will give nitro drip

## 2012-12-15 NOTE — Progress Notes (Signed)
Cardiology called and made aware of new consult.

## 2012-12-15 NOTE — H&P (Signed)
[facility] History and Physical  Bradley Bates JXB:[id number] DOB: August 31, 1945 DOA: 12/15/2012  Referring physician:  PCP: No PCP Per Patient  Specialists:   Chief Complaint: sob LE edema  HPI: Bradley Bates is a very pleasant 67 y.o. male with past medical history that includes hypertension and bronchitis presents to the emergency room today with the chief complaint of shortness of breath and lower extremity edema. Information is obtained from the patient and his wife is at bedside. This at reports worsening shortness of breath over the last 5 days as well as a gradual increase in lower extremity edema. He states he's been diagnosed with bronchitis in the past and has an inhaler which he used but it did not help. Associated symptoms include orthopnea, intermittent nonproductive cough, abdominal distention and worsening dyspnea with exertion. He also reports a 20 pound weight gain over the 3 months. His wife reports that he does not ambulate in the home and rarely leaves the home over the last several weeks due to his inability to breathe. He is not on home oxygen. He remarks that he has not taken his blood pressure medicine for several weeks due to an inability to pay for them. He has no regular primary care provider. He denies chest pain palpitations headache dizziness syncope or near-syncope. He denies abdominal pain nausea vomiting. He does report intermittent constipation but says his last bowel movement was yesterday. Denies bright red blood parenchymal or melena. He denies dysuria hematuria frequency or urgency. Lab work in the emergency room significant for proBNP 8443.0, platelets 141, AST 58 ALT 72.  Chest x-ray yieldsCardiomegaly with vascular congestion and basilar alveolar densities, likely reflecting edema. Oxygen saturation level dropped to 81% on room air. EKG yields sinus rhythm with occasional PVC and left ventricular hypertrophy. He was given 40 of Lasix IV in the emergency department  as well as nitroglycerin paste and nitroglycerin drip was started. Symptoms came on gradually have persisted and worsened characterized as severe. [facility] were asked to admit  Review of Systems: The patient denies anorexia, fever, weight loss,, vision loss, decreased hearing, hoarseness, chest pain, syncope,  balance deficits, hemoptysis, abdominal pain, melena, hematochezia, severe indigestion/heartburn, hematuria, incontinence, genital sores, muscle weakness, suspicious skin lesions, transient blindness, difficulty walking, depression, unusual weight change, abnormal bleeding, enlarged lymph nodes, angioedema, and breast masses.   Past Medical History  Diagnosis Date  . Hypertension   . Bronchitis    History reviewed. No pertinent past surgical history. Social History:  reports that he has been smoking Cigarettes.  He has been smoking about 1.00 pack per day. He does not have any smokeless tobacco history on file. He reports that he does not drink alcohol or use illicit drugs. 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 He lives with his wife and is independent with his ADLs.  No Known Allergies  History reviewed. No pertinent family history. mother deceased from lung cancer. Father deceased via execution when patient was 67 years old. He is 3 siblings their collective medical history is positive for hypertension diabetes and breast cancer  Prior to Admission medications   Medication Sig Start Date End Date Taking? Authorizing Provider  albuterol (PROVENTIL) (2.5 MG/3ML) 0.083% nebulizer solution Take 2.5 mg by nebulization every 6 (six) hours as needed for wheezing.   Yes Historical Provider, MD  ibuprofen (ADVIL,MOTRIN) 200 MG tablet Take 600 mg by mouth every 6 (six) hours as needed for pain.   Yes Historical Provider, MD  lisinopril-hydrochlorothiazide (PRINZIDE,ZESTORETIC) 20-12.5 MG per tablet Take 1 tablet by mouth daily.   Yes Historical Provider, MD   albuterol (PROVENTIL HFA;VENTOLIN HFA) 108 (90 BASE) MCG/ACT inhaler Inhale 2 puffs into the lungs every 6 (six) hours as needed for shortness of breath.     Historical Provider, MD   Physical Exam: Filed Vitals:   12/15/12 1004 12/15/12 1104 12/15/12 1111 12/15/12 1130  BP: 196/98 191/127 208/108 208/103  Pulse: 84  95 90  Temp: 98.2 F (36.8 C)     TempSrc: Oral     Resp: 21  20 27   Height: 5\' 10"  (1.778 m)     Weight: 220 lb (99.791 kg)     SpO2: 81%  97% 97%     General:  Obese no acute distress  Eyes: PE RRL, EOMI, no scleral icterus  ENT: Ears clear nose without drainage oropharynx without erythema or exudate. Because membranes of his mouth are moist and pink  Neck: Supple full range of motion no lymphadenopathy  Cardiovascular: Regular rate and rhythm gallop no 3+ pitting lower extremity edema bilaterally  Respiratory: Normal effort breath sounds somewhat diminished. Bilateral crackles particularly on the right to mid lobe. Faint expiratory wheeze diffusely  Abdomen: Somewhat distended positive bowel sounds nontender to palpation  Skin: Warm and dry no rash no lesion  Musculoskeletal: No clubbing no cyanosis  Psychiatric: Calm cooperative  Neurologic: Oriented x3. Cranial nerves II through XII grossly intact  Labs on Admission:  Basic Metabolic Panel:  Recent Labs Lab 12/15/12 1027  NA 142  K 3.8  CL 105  CO2 31  GLUCOSE 99  BUN 14  CREATININE 0.95  CALCIUM 8.5   Liver Function Tests:  Recent Labs Lab 12/15/12 1027  AST 58*  ALT 72*  ALKPHOS 58  BILITOT 0.4  PROT 6.4  ALBUMIN 3.3*   CBC:  Recent Labs Lab 12/15/12 1027  WBC 4.0  NEUTROABS 2.3  HGB 12.4*  HCT 38.8*  MCV 84.0  PLT 141*   Cardiac Enzymes:  Recent Labs Lab 12/15/12 1027  TROPONINI <0.30    BNP (last 3 results)  Recent Labs  12/15/12 1027  PROBNP 8443.0*   Radiological Exams on Admission: Dg Chest Portable 1 View  12/15/2012   *RADIOLOGY REPORT*   Clinical Data: CHF with shortness of breath and hypertension  PORTABLE CHEST - 1 VIEW  Comparison: Earlier the same day  Findings: 1029 hours. The cardiopericardial silhouette is enlarged. A pulmonary vascular congestion persists with some bibasilar alveolar opacity. Telemetry leads overlie the chest. Imaged bony structures of the thorax are intact.  IMPRESSION: Cardiomegaly with vascular congestion and basilar alveolar densities, likely reflecting edema.   Original Report Authenticated By: Kennith Center, M.D.   EKG: See history of present illness  Assessment/Plan Principal Problem:  Acute respiratory failure: Likely related to acute decompensated heart failure new-onset in the setting of bronchitis. Will admit to step down. Will provide oxygen supplementation and monitor oxygen saturation level. Patient much improved on my exam. Oxygen saturation level greater than 90% on 3 L of oxygen. Will provide Lasix IV and nebulizers. Continue his nitroglycerin drip.  Active Problems: Acute decompensated heart failure: New onset likely related to uncontrolled hypertension do to manage case and non-compliance as a result of no finances. Will continue IV Lasix. Will provide labetalol when necessary for uncontrolled pressure. Will obtain daily weights and monitor strict intake and output. Will get a 2-D echo. Will get serial troponins and obtain an EKG should the patient have  any chest pain. Will request a cardiology consult.  HTN (hypertension) urgency: Uncontrolled due to medication noncompliance as a result of limited resources. Continue nitroglycerin drip started in the emergency room. Will resume his lisinopril. Provide labetalol as needed. Follow closely. Elevated transaminase: Mild. Likely related to #2. Will monitor. Bronchitis: Appears to be at baseline. See #1. Thrombocytopenia: Mild. He related to acute illness. Monitor Anemia: Mild. No signs and symptoms of active bleeding. Likely related to chronic  disease.  Cardiology consult requested  Code Status: presumed full Family Communication: Wife at bedside  Plan: Home when  Time spent: 75 minutes  Gwenyth Bender [facility] Pager [phone]  If 7PM-7AM, please contact night-coverage [email or web address] Password [email or web address] 12/15/2012, 12:46 PM  I have seen and examined this patient and I agree with the above assessment and plan.  Pamella Pert, MD [facility] [phone]

## 2012-12-16 DIAGNOSIS — Z720 Tobacco use: Secondary | ICD-10-CM | POA: Diagnosis present

## 2012-12-16 LAB — HEPATIC FUNCTION PANEL
ALT: 68 U/L — ABNORMAL HIGH (ref 0–53)
AST: 44 U/L — ABNORMAL HIGH (ref 0–37)
Alkaline Phosphatase: 58 U/L (ref 39–117)
Bilirubin, Direct: 0.1 mg/dL (ref 0.0–0.3)
Total Bilirubin: 0.5 mg/dL (ref 0.3–1.2)

## 2012-12-16 LAB — BASIC METABOLIC PANEL
BUN: 13 mg/dL (ref 6–23)
GFR calc non Af Amer: 90 mL/min — ABNORMAL LOW (ref >90)
Glucose, Bld: 97 mg/dL (ref 70–99)
Potassium: 3.8 mEq/L (ref 3.5–5.1)

## 2012-12-16 LAB — CBC
HCT: 41.6 % (ref 39.0–52.0)
Hemoglobin: 13.2 g/dL (ref 13.0–17.0)
MCH: 26.9 pg (ref 26.0–34.0)
MCHC: 31.7 g/dL (ref 30.0–36.0)
MCV: 84.7 fL (ref 78.0–100.0)

## 2012-12-16 LAB — MAGNESIUM: Magnesium: 1.7 mg/dL (ref 1.5–2.5)

## 2012-12-16 NOTE — Progress Notes (Signed)
[facility] PROGRESS NOTE  Bradley Bates ZOX:[id number] DOB: Jan 06, 1946 DOA: 12/15/2012 PCP: No PCP Per Patient  HPI 67 y.o. male with past medical history that includes hypertension and bronchitis here with chief complaint of shortness of breath and lower extremity edema. Associated symptoms include orthopnea, intermittent nonproductive cough, abdominal distention and worsening dyspnea with exertion. He also reports a 20 pound weight gain over the 3 months. He remarks that he has not taken his blood pressure medicine for several weeks due to an inability to pay for them. He has no regular primary care provider.   Assessment/Plan: Acute decompensated heart failure: New onset likely related to uncontrolled hypertension do to medication non-compliance as a result of no finances. Volume status -1.6L. Weight 211lbs down from 220lbs on admission. Await 2-D echo results. Troponins neg to date. No complaints of chest pain. Appreciate cardiology assistance.  Acute respiratory failure: Likely related to acute decompensated heart failure new-onset in the setting of bronchitis and uncontrolled BP. Much improved this am.Oxygen saturation levels low 90's on room air and 95% on 2L. Will wean oxygen. Will transfer to tele and conduct oxygen trial. Continue IV Lasix. Continue nebulizers.  HTN (hypertension) urgency: Uncontrolled due to medication noncompliance as a result of limited resources. Improved. Patient was initially on a nitroglycerin drip and admitted to step down unit. Nitroglycerin drip discontinued upon restarting oral agents and improvement in his blood pressure. BB and amlodipine added to home dose lisinopril.  Elevated transaminase: Mild. Likely related to #1. Will monitor.  Bronchitis: Appears to be at baseline. Continue nebs. Thrombocytopenia: Resolved. Likely related to acute illness.  Anemia: Mild. No signs and symptoms of active bleeding. Likely related to chronic disease.  Tobacco use:  counseled regarding cessation  Code Status: full Family Communication: none present Disposition Plan: Home when medically ready, hopefully 1-2 days.   Consultants:  cardiology  Procedures:  none  Antibiotics:  none  HPI/Subjective: Awake alert. Complains of headache but states he feels better than he did yesterday  Objective: Filed Vitals:   12/16/12 0800  BP: 192/91  Pulse:   Temp:   Resp: 23    Intake/Output Summary (Last 24 hours) at 12/16/12 0834 Last data filed at 12/16/12 0800  Gross per 24 hour  Intake    950 ml  Output   3400 ml  Net  -2450 ml   Filed Weights   12/15/12 1004 12/16/12 0500  Weight: 220 lb (99.791 kg) 211 lb 10.3 oz (96 kg)    Exam:   General:  Obese NAD  Cardiovascular: RRR no gallup no rub. Trace -1+ LE edema  Respiratory: normal effort. Improved air movement. Mild diffuse rhonchi. No crackles no wheeze  Abdomen: soft +BS non-tender to BS. Non-distended  Musculoskeletal: no clubbing no cyanosis   Data Reviewed: Basic Metabolic Panel:  Recent Labs Lab 12/15/12 1027 12/15/12 1541 12/16/12 0443  NA 142 143 144  K 3.8 3.9 3.8  CL 105 103 104  CO2 31 34* 36*  GLUCOSE 99 147* 97  BUN 14 13 13   CREATININE 0.95 1.06 0.86  CALCIUM 8.5 8.7 8.7  MG  --   --  1.7  PHOS  --   --  4.8*   Liver Function Tests:  Recent Labs Lab 12/15/12 1027  AST 58*  ALT 72*  ALKPHOS 58  BILITOT 0.4  PROT 6.4  ALBUMIN 3.3*   CBC:  Recent Labs Lab 12/15/12 1027 12/16/12 0443  WBC 4.0 4.6  NEUTROABS 2.3  --  HGB 12.4* 13.2  HCT 38.8* 41.6  MCV 84.0 84.7  PLT 141* 150   Cardiac Enzymes:  Recent Labs Lab 12/15/12 1027 12/15/12 1541 12/15/12 2155  TROPONINI <0.30 <0.30 <0.30   BNP (last 3 results)  Recent Labs  12/15/12 1027  PROBNP 8443.0*    Recent Results (from the past 240 hour(s))  MRSA PCR SCREENING     Status: None   Collection Time    12/15/12  1:30 PM      Result Value Range Status   MRSA by PCR  NEGATIVE  NEGATIVE Final   Comment:            The GeneXpert MRSA Assay (FDA     approved for NASAL specimens     only), is one component of a     comprehensive MRSA colonization     surveillance program. It is not     intended to diagnose MRSA     infection nor to guide or     monitor treatment for     MRSA infections.     Studies: Dg Chest Portable 1 View  12/15/2012   *RADIOLOGY REPORT*  Clinical Data: CHF with shortness of breath and hypertension  PORTABLE CHEST - 1 VIEW  Comparison: Earlier the same day  Findings: 1029 hours. The cardiopericardial silhouette is enlarged. A pulmonary vascular congestion persists with some bibasilar alveolar opacity. Telemetry leads overlie the chest. Imaged bony structures of the thorax are intact.  IMPRESSION: Cardiomegaly with vascular congestion and basilar alveolar densities, likely reflecting edema.   Original Report Authenticated By: Kennith Center, M.D.    Scheduled Meds: . albuterol  2.5 mg Nebulization Q6H  . amLODipine  10 mg Oral Daily  . enoxaparin (LOVENOX) injection  40 mg Subcutaneous Q24H  . furosemide  40 mg Intravenous BID  . hydrALAZINE  25 mg Oral Q8H  . lisinopril  20 mg Oral Daily  . Living Better with Heart Failure Book   Does not apply Once  . metoprolol tartrate  25 mg Oral BID  . senna  1 tablet Oral BID  . sodium chloride  3 mL Intravenous Q12H   Continuous Infusions:   Principal Problem:   Acute respiratory failure Active Problems:   Acute decompensated heart failure   HTN (hypertension)   Bronchitis   Thrombocytopenia   Anemia   Elevated transaminase level  Time spent: 35 minutes  [facility] M  [facility] Pager [phone]. If 7PM-7AM, please contact night-coverage at [email or web address], password [facility] 12/16/2012, 8:34 AM  LOS: 1 day   I have seen and examined this patient and I agree with the above assessment and plan.  Pamella Pert, MD [facility] [phone]

## 2012-12-16 NOTE — Progress Notes (Signed)
UR chart review completed.  

## 2012-12-16 NOTE — Progress Notes (Signed)
Pt education about Heart Failure given to pt. Packet provided. Education done using the teach back method. A scale was also provided for the pt. Will continue to education.

## 2012-12-16 NOTE — Progress Notes (Signed)
Pt to be transferred to room 319 per MD order. Report called to RN. Pt transferred via wheelchair with personal belongings. Family at bedside and aware of transfer.

## 2012-12-16 NOTE — Progress Notes (Signed)
Patient ID: Bradley Bates, male   DOB: [birth year], 67 y.o.   MRN: [id number]    Subjective:  Denies SSCP, palpitations or Dyspnea   Objective:  Filed Vitals:   12/16/12 0601 12/16/12 0700 12/16/12 0709 12/16/12 0800  BP: 182/97 178/94  192/91  Pulse:  81    Temp:    97.4 F (36.3 C)  TempSrc:    Oral  Resp:  27  23  Height:      Weight:      SpO2:  95% 95%     Intake/Output from previous day:  Intake/Output Summary (Last 24 hours) at 12/16/12 0858 Last data filed at 12/16/12 0800  Gross per 24 hour  Intake    950 ml  Output   3400 ml  Net  -2450 ml    Physical Exam: Affect appropriate Overweight black male  HEENT: normal Neck supple with no adenopathy JVP normal no bruits no thyromegaly Lungs rales at base  no wheezing and good diaphragmatic motion Heart:  S1/S2 no murmur, no rub, gallop or click PMI normal Abdomen: benighn, BS positve, no tenderness, no AAA no bruit.  No HSM or HJR Distal pulses intact with no bruits No edema Neuro non-focal Skin warm and dry No muscular weakness   Lab Results: Basic Metabolic Panel:  Recent Labs  78/29/56 1541 12/16/12 0443  NA 143 144  K 3.9 3.8  CL 103 104  CO2 34* 36*  GLUCOSE 147* 97  BUN 13 13  CREATININE 1.06 0.86  CALCIUM 8.7 8.7  MG  --  1.7  PHOS  --  4.8*   Liver Function Tests:  Recent Labs  12/15/12 1027  AST 58*  ALT 72*  ALKPHOS 58  BILITOT 0.4  PROT 6.4  ALBUMIN 3.3*  CBC:  Recent Labs  12/15/12 1027 12/16/12 0443  WBC 4.0 4.6  NEUTROABS 2.3  --   HGB 12.4* 13.2  HCT 38.8* 41.6  MCV 84.0 84.7  PLT 141* 150   Cardiac Enzymes:  Recent Labs  12/15/12 1027 12/15/12 1541 12/15/12 2155  TROPONINI <0.30 <0.30 <0.30    Imaging: Dg Chest Portable 1 View  12/15/2012   *RADIOLOGY REPORT*  Clinical Data: CHF with shortness of breath and hypertension  PORTABLE CHEST - 1 VIEW  Comparison: Earlier the same day  Findings: 1029 hours. The cardiopericardial silhouette is enlarged. A  pulmonary vascular congestion persists with some bibasilar alveolar opacity. Telemetry leads overlie the chest. Imaged bony structures of the thorax are intact.  IMPRESSION: Cardiomegaly with vascular congestion and basilar alveolar densities, likely reflecting edema.   Original Report Authenticated By: Kennith Center, M.D.    Cardiac Studies:  ECG: SR LVH PVC    Telemetry:  NSR no VT or afib 12/16/2012   Echo: pending   Medications:   . albuterol  2.5 mg Nebulization Q6H  . amLODipine  10 mg Oral Daily  . enoxaparin (LOVENOX) injection  40 mg Subcutaneous Q24H  . furosemide  40 mg Intravenous BID  . hydrALAZINE  25 mg Oral Q8H  . lisinopril  20 mg Oral Daily  . Living Better with Heart Failure Book   Does not apply Once  . metoprolol tartrate  25 mg Oral BID  . senna  1 tablet Oral BID  . 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  Increase lisinopril to 40 mg and continue iv  diuretic HTN:  Continue 4 drug Rx Smoking:  Discussed cessation and link between nicotine and HTN  Compliance post discharge will be important  Charlton Haws 12/16/2012, 8:58 AM

## 2012-12-17 DIAGNOSIS — D696 Thrombocytopenia, unspecified: Secondary | ICD-10-CM

## 2012-12-17 LAB — HEPATIC FUNCTION PANEL
ALT: 53 U/L (ref 0–53)
AST: 29 U/L (ref 0–37)
Albumin: 3.2 g/dL — ABNORMAL LOW (ref 3.5–5.2)
Alkaline Phosphatase: 57 U/L (ref 39–117)
Total Protein: 6.7 g/dL (ref 6.0–8.3)

## 2012-12-17 LAB — BASIC METABOLIC PANEL
CO2: 41 mEq/L (ref 19–32)
Chloride: 99 mEq/L (ref 96–112)
Creatinine, Ser: 0.85 mg/dL (ref 0.50–1.35)
GFR calc Af Amer: >90 mL/min (ref >90)
Potassium: 3.8 mEq/L (ref 3.5–5.1)
Sodium: 144 mEq/L (ref 135–145)

## 2012-12-17 MED ORDER — ISOSORBIDE MONONITRATE ER 60 MG PO TB24
30.0000 mg | ORAL_TABLET | Freq: Every day | ORAL | Status: DC
  Administered 2012-12-17 – 2012-12-19 (×3): 30 mg via ORAL
  Filled 2012-12-17 (×3): qty 1

## 2012-12-17 MED ORDER — POTASSIUM CHLORIDE CRYS ER 20 MEQ PO TBCR: 40.0000 meq | EXTENDED_RELEASE_TABLET | Freq: Every day | ORAL | Status: DC

## 2012-12-17 MED ORDER — FUROSEMIDE 10 MG/ML IJ SOLN
60.0000 mg | Freq: Three times a day (TID) | INTRAMUSCULAR | Status: DC
  Administered 2012-12-17 – 2012-12-18 (×5): 60 mg via INTRAVENOUS
  Filled 2012-12-17 (×2): qty 6
  Filled 2012-12-17: qty 4
  Filled 2012-12-17 (×2): qty 6
  Filled 2012-12-17: qty 2

## 2012-12-17 MED ORDER — POTASSIUM CHLORIDE CRYS ER 20 MEQ PO TBCR
20.0000 meq | EXTENDED_RELEASE_TABLET | Freq: Every day | ORAL | Status: DC
  Administered 2012-12-17 – 2012-12-19 (×3): 20 meq via ORAL
  Filled 2012-12-17 (×3): qty 1

## 2012-12-17 MED ORDER — CARVEDILOL 3.125 MG PO TABS
6.2500 mg | ORAL_TABLET | Freq: Two times a day (BID) | ORAL | Status: DC
  Administered 2012-12-17 – 2012-12-18 (×2): 6.25 mg via ORAL
  Filled 2012-12-17 (×2): qty 2

## 2012-12-17 MED ORDER — LISINOPRIL 10 MG PO TABS
40.0000 mg | ORAL_TABLET | Freq: Every day | ORAL | Status: DC
  Administered 2012-12-17 – 2012-12-19 (×3): 40 mg via ORAL
  Filled 2012-12-17 (×3): qty 4

## 2012-12-17 NOTE — Progress Notes (Signed)
[facility] PROGRESS NOTE  Bradley Bates JXB:[id number] DOB: 12-07-1945 DOA: 12/15/2012 PCP: No PCP Per Patient  Assessment/Plan: Acute decompensated heart failure: New onset likely related to uncontrolled hypertension do to medication non-compliance as a result of no finances. Volume status -2.6L. Weight 207lbs down from 220lbs on admission.  2-D echo yields EF 30%. Troponins neg to date. No complaints of chest pain. Remains sob and hypoxic on room air. Will increase lasix per cardiology. Appreciate assistance.  Acute respiratory failure: Likely related to acute decompensated heart failure new-onset in the setting of bronchitis and uncontrolled BP. Continues with desaturation on room air at rest. Sats 95% on 2L. Continue IV Lasix as above. Continue nebulizers.  HTN (hypertension) urgency: Uncontrolled due to medication noncompliance as a result of limited resources. Lisinopril increased yesterday. Continues to improve.  BB and amlodipine added to home lisinopril.  Elevated transaminase: resolved. Likely related to #1.   Bronchitis: Appears to be at baseline. Continue nebs.  Thrombocytopenia: Resolved. Likely related to acute illness.  Anemia: Mild. No signs and symptoms of active bleeding. Likely related to chronic disease.  Tobacco use: counseled regarding cessation    Code Status: full Family Communication: none present Disposition Plan: home when ready    Consultants:  cardiology  Procedures:  none  Antibiotics:  none  HPI/Subjective: Sitting on side of bed. Reports difficulty breathing when lying down and on minimal exertion.  Objective: Filed Vitals:   12/17/12 0633  BP: 150/77  Pulse:   Temp:   Resp:     Intake/Output Summary (Last 24 hours) at 12/17/12 1015 Last data filed at 12/17/12 1308  Gross per 24 hour  Intake    940 ml  Output    900 ml  Net     40 ml   Filed Weights   12/15/12 1004 12/16/12 0500 12/17/12 0633  Weight: 220 lb (99.791 kg) 211 lb  10.3 oz (96 kg) 207 lb 12.8 oz (94.257 kg)    Exam:   General:  Obese alert NAD  Cardiovascular: RRR No MGR  1+ LE edema  Respiratory: mild increased work of breathing with minimal exertion. BS distant faint crackles bilaterally.   Abdomen: obese soft +BS non-tender to palpation  Musculoskeletal:  MAE no clubbing no cyanosis  Data Reviewed: Basic Metabolic Panel:  Recent Labs Lab 12/15/12 1027 12/15/12 1541 12/16/12 0443 12/17/12 0456  NA 142 143 144 144  K 3.8 3.9 3.8 3.8  CL 105 103 104 99  CO2 31 34* 36* 41*  GLUCOSE 99 147* 97 98  BUN 14 13 13 13   CREATININE 0.95 1.06 0.86 0.85  CALCIUM 8.5 8.7 8.7 9.0  MG  --   --  1.7  --   PHOS  --   --  4.8*  --    Liver Function Tests:  Recent Labs Lab 12/15/12 1027 12/16/12 0443 12/17/12 0456  AST 58* 44* 29  ALT 72* 68* 53  ALKPHOS 58 58 57  BILITOT 0.4 0.5 0.4  PROT 6.4 6.7 6.7  ALBUMIN 3.3* 3.3* 3.2*   No results found for this basename: LIPASE, AMYLASE,  in the last 168 hours No results found for this basename: AMMONIA,  in the last 168 hours CBC:  Recent Labs Lab 12/15/12 1027 12/16/12 0443  WBC 4.0 4.6  NEUTROABS 2.3  --   HGB 12.4* 13.2  HCT 38.8* 41.6  MCV 84.0 84.7  PLT 141* 150   Cardiac Enzymes:  Recent Labs Lab 12/15/12 1027 12/15/12 1541 12/15/12  2155  TROPONINI <0.30 <0.30 <0.30   BNP (last 3 results)  Recent Labs  12/15/12 1027  PROBNP 8443.0*   CBG: No results found for this basename: GLUCAP,  in the last 168 hours  Recent Results (from the past 240 hour(s))  MRSA PCR SCREENING     Status: None   Collection Time    12/15/12  1:30 PM      Result Value Range Status   MRSA by PCR NEGATIVE  NEGATIVE Final   Comment:            The GeneXpert MRSA Assay (FDA     approved for NASAL specimens     only), is one component of a     comprehensive MRSA colonization     surveillance program. It is not     intended to diagnose MRSA     infection nor to guide or     monitor  treatment for     MRSA infections.     Studies: Dg Chest Portable 1 View  12/15/2012   *RADIOLOGY REPORT*  Clinical Data: CHF with shortness of breath and hypertension  PORTABLE CHEST - 1 VIEW  Comparison: Earlier the same day  Findings: 1029 hours. The cardiopericardial silhouette is enlarged. A pulmonary vascular congestion persists with some bibasilar alveolar opacity. Telemetry leads overlie the chest. Imaged bony structures of the thorax are intact.  IMPRESSION: Cardiomegaly with vascular congestion and basilar alveolar densities, likely reflecting edema.   Original Report Authenticated By: Kennith Center, M.D.    Scheduled Meds: . albuterol  2.5 mg Nebulization Q6H  . amLODipine  10 mg Oral Daily  . enoxaparin (LOVENOX) injection  40 mg Subcutaneous Q24H  . furosemide  40 mg Intravenous BID  . hydrALAZINE  25 mg Oral Q8H  . lisinopril  40 mg Oral Daily  . metoprolol tartrate  25 mg Oral BID  . senna  1 tablet Oral BID  . sodium chloride  3 mL Intravenous Q12H   Continuous Infusions:   Principal Problem:   Acute respiratory failure Active Problems:   Acute decompensated heart failure   HTN (hypertension)   Bronchitis   Thrombocytopenia   Anemia   Elevated transaminase level   Tobacco abuse    Time spent: 30 minutes    [facility] M  [facility] Pager [phone]. If 7PM-7AM, please contact night-coverage at [email or web address], password [facility] 12/17/2012, 10:15 AM  LOS: 2 days

## 2012-12-17 NOTE — Progress Notes (Signed)
Patient seen, independently examined and chart reviewed. I agree with exam, assessment and plan discussed with Toya Smothers, NP.  Very pleasant 67 year old man admitted for acute respiratory failure secondary to acute decompensated heart failure complicated by noncompliance, lack of primary care provider with medications; hypertensive urgency secondary to medication noncompliance. Echocardiogram revealed systolic dysfunction with ejection fraction of 30%. Troponin negative. Overall he is improved with diuresis thus far. We will continue management as started by cardiology.  Close outpatient followup will be critical as well as compliance with medications and discontinuing smoking.   Brendia Sacks, MD [facility] [phone]

## 2012-12-17 NOTE — Care Management Note (Signed)
    Page 1 of 1   12/19/2012     1:43:07 PM   CARE MANAGEMENT NOTE 12/19/2012  Patient:  Bradley Bates, Bradley Bates   Account Number:  [id number]  Date Initiated:  12/17/2012  Documentation initiated by:  Sharrie Rothman  Subjective/Objective Assessment:   Pt admitted from home with CHF. Pt lives with his wife and will return home at discharge. Pt is independent with ADL's.     Action/Plan:   NO CM needs noted. Staff has given pt a set of scales.   Anticipated DC Date:  12/20/2012   Anticipated DC Plan:  HOME/SELF CARE      DC Planning Services  CM consult      Choice offered to / List presented to:             Status of service:  Completed, signed off Medicare Important Message given?   (If response is "NO", the following Medicare IM given date fields will be blank) Date Medicare IM given:   Date Additional Medicare IM given:    Discharge Disposition:  HOME W HOME HEALTH SERVICES  Per UR Regulation:    If discussed at Long Length of Stay Meetings, dates discussed:    Comments:  12/19/12 1340 Arlyss Queen, RN BSN CM Pt discharged home today with [facility] RN with [facility] and O2 with [facility]. HH and DME was arranged by [facility]. [facility] is aware of referral and will start within 48 hours of discharge. [facility] to deliver portable O2 to pt prior to discharge and will deliver concentrator to pts home. Pt given a list of discharge medications that are on [facility] $4 list.Pt and pts nurse aware of discharge arrangements.  12/17/12 1520 Arlyss Queen, RN BSN CM

## 2012-12-17 NOTE — Progress Notes (Signed)
The patient was seen and examined, and I agree with the assessment and plan as documented above. Pt with severely reduced LV systolic function, EF 30%, and dilated LV. Would increase frequency of IV Lasix (and dose) to 60 mg IV TID. Currently on Norvasc, Hydralazine, Lisinopril, and Metoprolol.  I would consider switching Metoprolol to Carvedilol, as this will help with both the anti-hypertensive effect and could potentially help with improvement in contractility over time. I would also consider starting long-acting nitrates to help unload the LV.

## 2012-12-17 NOTE — Progress Notes (Signed)
SUBJECTIVE: Complaints of shortness of breath. Generalized fatigue.   Principal Problem:   Acute respiratory failure Active Problems:   Acute decompensated heart failure   HTN (hypertension)   Bronchitis   Thrombocytopenia   Anemia   Elevated transaminase level   Tobacco abuse   LABS: Basic Metabolic Panel:  Recent Labs  16/10/96 1541 12/16/12 0443 12/17/12 0456  NA 143 144 144  K 3.9 3.8 3.8  CL 103 104 99  CO2 34* 36* 41*  GLUCOSE 147* 97 98  BUN 13 13 13   CREATININE 1.06 0.86 0.85  CALCIUM 8.7 8.7 9.0  MG  --  1.7  --   PHOS  --  4.8*  --    Liver Function Tests:  Recent Labs  12/16/12 0443 12/17/12 0456  AST 44* 29  ALT 68* 53  ALKPHOS 58 57  BILITOT 0.5 0.4  PROT 6.7 6.7  ALBUMIN 3.3* 3.2*   CBC:  Recent Labs  12/15/12 1027 12/16/12 0443  WBC 4.0 4.6  NEUTROABS 2.3  --   HGB 12.4* 13.2  HCT 38.8* 41.6  MCV 84.0 84.7  PLT 141* 150   Cardiac Enzymes:  Recent Labs  12/15/12 1027 12/15/12 1541 12/15/12 2155  TROPONINI <0.30 <0.30 <0.30   RADIOLOGY: Dg Chest Portable 1 View  12/15/2012   *RADIOLOGY REPORT*  Clinical Data: CHF with shortness of breath and hypertension  PORTABLE CHEST - 1 VIEW  Comparison: Earlier the same day  Findings: 1029 hours. The cardiopericardial silhouette is enlarged. A pulmonary vascular congestion persists with some bibasilar alveolar opacity. Telemetry leads overlie the chest. Imaged bony structures of the thorax are intact.  IMPRESSION: Cardiomegaly with vascular congestion and basilar alveolar densities, likely reflecting edema.   Original Report Authenticated By: Kennith Center, M.D.   Echocardiogram: Left ventricle: The cavity size was severely dilated. Wall thickness was normal. The estimated ejection fraction was 30%. Diffuse hypokinesis. - Aortic valve: Trivial regurgitation. Valve area: 2.27cm^2(VTI). Valve area: 2.11cm^2 (Vmax). - Mitral valve: Mild regurgitation. - Left atrium: The atrium was moderately  dilated. - Atrial septum: No defect or patent foramen ovale was identified. - Pulmonary arteries: PA peak pressure: 50mm Hg (S).    PHYSICAL EXAM BP 150/77  Pulse 84  Temp(Src) 97.7 F (36.5 C) (Oral)  Resp 22  Ht 5\' 10"  (1.778 m)  Wt 207 lb 12.8 oz (94.257 kg)  BMI 29.82 kg/m2  SpO2 93% General: Well developed, well nourished, in no acute distress Head: Eyes PERRLA, No xanthomas.   Normal cephalic and atramatic  Lungs: Inspiratory rales and rhonchi with expiratory wheezes noted with coughing, Heart: HRRR S1 S2, No MRG .  Pulses are 2+ & equal.            No carotid bruit. Positive JVD at 10 cm.   No abdominal bruits. Abdomen: Bowel sounds are positive, abdomen soft and non-tender without masses or                  Hernia's noted. Msk:  Back normal, normal gait. Normal strength and tone for age. Extremities: No clubbing, cyanosis, mild lower extremity dependent edema.  DP +1 Neuro: Alert and oriented X 3. Psych:  Good affect, responds appropriately  TELEMETRY: Reviewed telemetry pt in SR with PAC's.   ASSESSMENT AND PLAN:  1. Acute Systolic CHF: With Pulmonary Edema : Patient continues to have complaints of dyspnea in the setting of systolic dysfunction EF of 30%. Pro-BNP 8443.0 He has diuresed with 4 lb wt loss since  admission on lasix 40 mg IV BID. Troponin is negative is negative.  He continues on lisinopril 40 mg daily, with metoprolol 25 mg BID. Heart rate is in the 70-80's. I think the he would benefit from cardiac cath with significantly decreased systolic function with  CVRF of hypertension, smoking, uncertain cholesterol status.Creatinine of 0.85.  Check cholesterol panel.  Will discuss this with Dr. Beulah Gandy.   2. Hypertension: Blood pressure is stable is stable currently. He is on multiple medications on admission to include amlodipine, hydralazine, and ACE. He was hypertensive on admit at 208/108.   3. Tobacco abuse: Smoking cessation is recommended.    Bettey Mare. Lyman Bishop NP [facility] 12/17/2012, 9:01 AM

## 2012-12-18 LAB — LIPID PANEL
HDL: 40 mg/dL (ref >39)
Total CHOL/HDL Ratio: 4.6 RATIO
VLDL: 20 mg/dL (ref 0–40)

## 2012-12-18 MED ORDER — CARVEDILOL 12.5 MG PO TABS
12.5000 mg | ORAL_TABLET | Freq: Two times a day (BID) | ORAL | Status: DC
  Administered 2012-12-18 – 2012-12-19 (×2): 12.5 mg via ORAL
  Filled 2012-12-18 (×2): qty 1

## 2012-12-18 MED ORDER — SPIRONOLACTONE 25 MG PO TABS
25.0000 mg | ORAL_TABLET | Freq: Every day | ORAL | Status: DC
  Administered 2012-12-18 – 2012-12-19 (×2): 25 mg via ORAL
  Filled 2012-12-18 (×2): qty 1

## 2012-12-18 MED ORDER — FUROSEMIDE 80 MG PO TABS: 80.0000 mg | ORAL_TABLET | Freq: Two times a day (BID) | ORAL | Status: DC

## 2012-12-18 MED ORDER — FUROSEMIDE 40 MG PO TABS
40.0000 mg | ORAL_TABLET | Freq: Two times a day (BID) | ORAL | Status: DC
  Administered 2012-12-19: 40 mg via ORAL
  Filled 2012-12-18: qty 1

## 2012-12-18 NOTE — Progress Notes (Signed)
Patient seen, independently examined and chart reviewed. I agree with exam, assessment and plan discussed with Toya Smothers, NP.  Overall he feels somewhat better. Breathing is short but also somewhat better. He is known to be hypoxic on room air. Vitals remained stable. Urine output not impressive.  He sitting up in eating lunch and appears quite stable. Lungs are clear.  We will continue treatment for decompensated systolic heart failure as guided by cardiology. He will likely need home oxygen. Close outpatient followup will be critical as will be compliance with medications and stopping smoking.  Brendia Sacks, MD [facility] [phone]

## 2012-12-18 NOTE — Progress Notes (Signed)
Patients O2 at 83-86% while ambulating on RA.  Comes up to 88-90% on 2L [place].  At rest patient is at 92% on 2L [place]

## 2012-12-18 NOTE — Plan of Care (Signed)
Problem: Phase II Progression Outcomes Goal: Discharge plan established Outcome: Completed/Met Date Met:  12/18/12 Plan to DC home Friday with home O2

## 2012-12-18 NOTE — Progress Notes (Signed)
SUBJECTIVE: He is feeling better, has been up walking in the halls. States his O2 dropped and will need home O2.   Principal Problem:   Acute respiratory failure Active Problems:   Acute decompensated heart failure   HTN (hypertension)   Bronchitis   Thrombocytopenia   Anemia   Elevated transaminase level   Tobacco abuse   LABS: Basic Metabolic Panel:  Recent Labs  16/10/96 1541 12/16/12 0443 12/17/12 0456  NA 143 144 144  K 3.9 3.8 3.8  CL 103 104 99  CO2 34* 36* 41*  GLUCOSE 147* 97 98  BUN 13 13 13   CREATININE 1.06 0.86 0.85  CALCIUM 8.7 8.7 9.0  MG  --  1.7  --   PHOS  --  4.8*  --    Liver Function Tests:  Recent Labs  12/16/12 0443 12/17/12 0456  AST 44* 29  ALT 68* 53  ALKPHOS 58 57  BILITOT 0.5 0.4  PROT 6.7 6.7  ALBUMIN 3.3* 3.2*   CBC:  Recent Labs  12/16/12 0443  WBC 4.6  HGB 13.2  HCT 41.6  MCV 84.7  PLT 150   Cardiac Enzymes:  Recent Labs  12/15/12 1541 12/15/12 2155  TROPONINI <0.30 <0.30   Fasting Lipid Panel:  Recent Labs  12/18/12 0454  CHOL 182  HDL 40  LDLCALC 122*  TRIG 99  CHOLHDL 4.6  Echocardiogram 12/15/2012 Left ventricle: The cavity size was severely dilated. Wall thickness was normal. The estimated ejection fraction was 30%. Diffuse hypokinesis. - Aortic valve: Trivial regurgitation. Valve area: 2.27cm^2(VTI). Valve area: 2.11cm^2 (Vmax). - Mitral valve: Mild regurgitation. - Left atrium: The atrium was moderately dilated. - Atrial septum: No defect or patent foramen ovale was identified. - Pulmonary arteries: PA peak pressure: 50mm Hg (S).    PHYSICAL EXAM BP 160/86  Pulse 84  Temp(Src) 98.6 F (37 C) (Oral)  Resp 20  Ht 5\' 10"  (1.778 m)  Wt 206 lb 9.1 oz (93.7 kg)  BMI 29.64 kg/m2  SpO2 96% Wt Loss: 5 lbs since admission from 211 to 206.  General: Well developed, well nourished, in no acute distress Head: Eyes PERRLA, No xanthomas.   Normal cephalic and atramatic  Lungs:  Diminished breath  sounds bibasilar, some scattered crackles.  Heart: HRRR S1 S2, No MRG .  Pulses are 2+ & equal.            No carotid bruit. No JVD.  No abdominal bruits. No femoral bruits. Abdomen: Bowel sounds are positive, abdomen soft and non-tender without masses or                  Hernia's noted. Msk:  Back normal, normal gait. Normal strength and tone for age. Extremities: No clubbing, cyanosis or edema.  DP +1 Neuro: Alert and oriented X 3. Psych:  Good affect, responds appropriately  TELEMETRY: Reviewed telemetry pt in: NSR rates in the 80-90's.   ASSESSMENT AND PLAN:  1.Acute on Chronic CHF with Pulmonary Edema: He has diuresed down 5 lbs since admission with no significant improvement of breathing status. Breath sounds are diminished. Overall appears improved, without edema. Will give the evening dose of Lasix and change to PO in am. Add spironolactone. Repeat CXR early am for comparison with breath sounds unimproved. Creatinine 0.85.   2. Systolic Dysfunction: Remains on ACE inhibitor, with hydralazine and isosorbide. Continues on low dose Coreg 6. 25 mg BID, would titrate up to 12.50 mg BID on discharge. High likelihood of cardiac arrhythmia.  If he remains compliant with his medications, will repeat echo in 3 months for evaluation of improvement. If not, consider ICD.  2. Hypertension: BP is running 140's to 160's systolic over last 24 hours. He is on multiple antihypertensives to include amlodipine 10mg , hydralazine 25 mg BID, lisinopril 40 mg daily, and isosorbide 30 mg daily. HR in the 80;s-90's.   3. Chronic Bronchitis: Walking O2 saturations are requiring O2 support at home. This is being planned for discharge, probably tomorrow. Watch closely for bronchospams with moderate dose BB.    Bettey Mare. Lyman Bishop NP [facility] 12/18/2012, 3:13 PM

## 2012-12-18 NOTE — Progress Notes (Signed)
Pt's O2 sats at rest on RA 84-87%.

## 2012-12-18 NOTE — Progress Notes (Signed)
[facility] PROGRESS NOTE  Bradley Bates ZOX:[id number] DOB: 08-03-1945 DOA: 12/15/2012 PCP: No PCP Per Patient  Assessment/Plan: Acute decompensated heart failure: New onset likely related to uncontrolled hypertension do to medication non-compliance as a result of no finances. Volume status -2.9L. Weight 206lbs down from 220lbs on admission. 2-D echo yields EF 30%. Troponins neg.. No complaints of chest pain. Remains sob and hypoxic on room air. Will likely need home O2. Will defer to cardiology when to transition lasix to po and when pt ready for discharge.   Acute respiratory failure: Likely related to acute decompensated heart failure new-onset in the setting of bronchitis and uncontrolled BP. Continues with desaturation on room air at rest. Sats 95% on 2L. Will likely need home oxygen. Continue nebulizers.  HTN (hypertension) urgency: only fair control.  Lisinopril increased 12/17/12. Continues to improve. BB changed from metoprolol to carvedilol and imdur added to amlodipine and to home lisinopril. Pt also on hydralazine Elevated transaminase: resolved. Likely related to #1.  Bronchitis: Appears to be at baseline. Continue nebs.  Thrombocytopenia: Resolved. Likely related to acute illness.  Anemia: Mild. No signs and symptoms of active bleeding. Likely related to chronic disease.  Tobacco use: counseled regarding cessation    Code Status: full Family Communication: none present Disposition Plan: home when cardiology oks   Consultants:  cardilogy  Procedures:  none  Antibiotics:  none  HPI/Subjective: Sitting on side of bed. States better than on admission but still sob without oxygen.  Objective: Filed Vitals:   12/18/12 1400  BP: 160/86  Pulse: 84  Temp: 98.6 F (37 C)  Resp: 20    Intake/Output Summary (Last 24 hours) at 12/18/12 1500 Last data filed at 12/18/12 1100  Gross per 24 hour  Intake      0 ml  Output    900 ml  Net   -900 ml   Filed Weights   12/16/12 0500 12/17/12 0633 12/18/12 0520  Weight: 211 lb 10.3 oz (96 kg) 207 lb 12.8 oz (94.257 kg) 206 lb 9.1 oz (93.7 kg)    Exam:   General:  Obese NAD sitting on side of bed  Cardiovascular: RRR No murmur gallup or rub. Trace LE edema  Respiratory: mild increased work of breathing with ambulation. Distant BS no crackles no wheeze  Abdomen: obese soft +BS non-tender to palpation  Musculoskeletal: no clubbing no cyanosis   Data Reviewed: Basic Metabolic Panel:  Recent Labs Lab 12/15/12 1027 12/15/12 1541 12/16/12 0443 12/17/12 0456  NA 142 143 144 144  K 3.8 3.9 3.8 3.8  CL 105 103 104 99  CO2 31 34* 36* 41*  GLUCOSE 99 147* 97 98  BUN 14 13 13 13   CREATININE 0.95 1.06 0.86 0.85  CALCIUM 8.5 8.7 8.7 9.0  MG  --   --  1.7  --   PHOS  --   --  4.8*  --    Liver Function Tests:  Recent Labs Lab 12/15/12 1027 12/16/12 0443 12/17/12 0456  AST 58* 44* 29  ALT 72* 68* 53  ALKPHOS 58 58 57  BILITOT 0.4 0.5 0.4  PROT 6.4 6.7 6.7  ALBUMIN 3.3* 3.3* 3.2*   No results found for this basename: LIPASE, AMYLASE,  in the last 168 hours No results found for this basename: AMMONIA,  in the last 168 hours CBC:  Recent Labs Lab 12/15/12 1027 12/16/12 0443  WBC 4.0 4.6  NEUTROABS 2.3  --   HGB 12.4* 13.2  HCT 38.8*  41.6  MCV 84.0 84.7  PLT 141* 150   Cardiac Enzymes:  Recent Labs Lab 12/15/12 1027 12/15/12 1541 12/15/12 2155  TROPONINI <0.30 <0.30 <0.30   BNP (last 3 results)  Recent Labs  12/15/12 1027  PROBNP 8443.0*   CBG: No results found for this basename: GLUCAP,  in the last 168 hours  Recent Results (from the past 240 hour(s))  MRSA PCR SCREENING     Status: None   Collection Time    12/15/12  1:30 PM      Result Value Range Status   MRSA by PCR NEGATIVE  NEGATIVE Final   Comment:            The GeneXpert MRSA Assay (FDA     approved for NASAL specimens     only), is one component of a     comprehensive MRSA colonization      surveillance program. It is not     intended to diagnose MRSA     infection nor to guide or     monitor treatment for     MRSA infections.     Studies: No results found.  Scheduled Meds: . albuterol  2.5 mg Nebulization Q6H  . amLODipine  10 mg Oral Daily  . carvedilol  6.25 mg Oral BID WC  . enoxaparin (LOVENOX) injection  40 mg Subcutaneous Q24H  . furosemide  60 mg Intravenous TID  . hydrALAZINE  25 mg Oral Q8H  . isosorbide mononitrate  30 mg Oral Daily  . lisinopril  40 mg Oral Daily  . potassium chloride  20 mEq Oral Daily  . senna  1 tablet Oral BID  . sodium chloride  3 mL Intravenous Q12H   Continuous Infusions:   Principal Problem:   Acute respiratory failure Active Problems:   Acute decompensated heart failure   HTN (hypertension)   Bronchitis   Thrombocytopenia   Anemia   Elevated transaminase level   Tobacco abuse    Time spent: 30 minutes    [facility] M  [facility] Pager [phone]. If 7PM-7AM, please contact night-coverage at [email or web address], password [facility] 12/18/2012, 3:00 PM  LOS: 3 days

## 2012-12-18 NOTE — Progress Notes (Signed)
The patient was seen and examined, and I agree with the assessment and plan as documented above. I agree with tighter BP control given his depressed systolic function, and can increase Coreg to help achieve this. I also concur with the addition of Spironolactone. Diminished breath sounds at bases with bibasilar rales as well, so agree with repeating chest x-ray, given his continued need for oxygen.

## 2012-12-19 ENCOUNTER — Inpatient Hospital Stay (HOSPITAL_COMMUNITY): Payer: 59

## 2012-12-19 LAB — BASIC METABOLIC PANEL
Calcium: 9.3 mg/dL (ref 8.4–10.5)
GFR calc Af Amer: >90 mL/min (ref >90)
GFR calc non Af Amer: 88 mL/min — ABNORMAL LOW (ref >90)
Glucose, Bld: 90 mg/dL (ref 70–99)
Potassium: 4 mEq/L (ref 3.5–5.1)
Sodium: 142 mEq/L (ref 135–145)

## 2012-12-19 MED ORDER — SPIRONOLACTONE 25 MG PO TABS: 25.0000 mg | ORAL_TABLET | Freq: Every day | ORAL | Status: DC

## 2012-12-19 MED ORDER — ISOSORBIDE MONONITRATE ER 30 MG PO TB24: 30.0000 mg | ORAL_TABLET | Freq: Every day | ORAL | Status: DC

## 2012-12-19 MED ORDER — ALBUTEROL SULFATE (5 MG/ML) 0.5% IN NEBU
2.5000 mg | INHALATION_SOLUTION | Freq: Three times a day (TID) | RESPIRATORY_TRACT | Status: DC
  Administered 2012-12-19 (×2): 2.5 mg via RESPIRATORY_TRACT
  Filled 2012-12-19 (×2): qty 0.5

## 2012-12-19 MED ORDER — HYDRALAZINE HCL 25 MG PO TABS: 25.0000 mg | ORAL_TABLET | Freq: Three times a day (TID) | ORAL | Status: DC

## 2012-12-19 MED ORDER — POTASSIUM CHLORIDE CRYS ER 20 MEQ PO TBCR: 20.0000 meq | EXTENDED_RELEASE_TABLET | Freq: Every day | ORAL | Status: DC

## 2012-12-19 MED ORDER — ATORVASTATIN CALCIUM 10 MG PO TABS: 10.0000 mg | ORAL_TABLET | Freq: Every day | ORAL | Status: DC

## 2012-12-19 MED ORDER — AMLODIPINE BESYLATE 10 MG PO TABS: 10.0000 mg | ORAL_TABLET | Freq: Every day | ORAL | Status: DC

## 2012-12-19 MED ORDER — ALBUTEROL SULFATE (5 MG/ML) 0.5% IN NEBU: 2.5000 mg | INHALATION_SOLUTION | RESPIRATORY_TRACT | Status: DC | PRN

## 2012-12-19 MED ORDER — ATORVASTATIN CALCIUM 10 MG PO TABS
10.0000 mg | ORAL_TABLET | Freq: Every day | ORAL | Status: DC
Start: 2012-12-19 — End: 2012-12-19

## 2012-12-19 MED ORDER — LISINOPRIL 40 MG PO TABS: 40.0000 mg | ORAL_TABLET | Freq: Every day | ORAL | Status: DC

## 2012-12-19 MED ORDER — FUROSEMIDE 40 MG PO TABS: 40.0000 mg | ORAL_TABLET | Freq: Two times a day (BID) | ORAL | Status: DC

## 2012-12-19 MED ORDER — CARVEDILOL 12.5 MG PO TABS: 12.5000 mg | ORAL_TABLET | Freq: Two times a day (BID) | ORAL | Status: DC

## 2012-12-19 NOTE — Discharge Summary (Signed)
Patient seen, independently examined and chart reviewed. I agree with exam, assessment and plan discussed with Toya Smothers, NP.  I agree with discharge home.  Overall he feels much better today. He is breathing better and denies any new issues. Discussed with Dr. Tenny Craw and he is clear for discharge on current regimen. He will followup with cardiology in the near future.  Respiratory failure is stable, likely secondary to heart failure, may improve over time with treatment of heart failure and compliance with regimen. Recommend discontinuing ibuprofen which has been updated on his discharge instructions.   Brendia Sacks, MD [facility] [phone]

## 2012-12-19 NOTE — Progress Notes (Signed)
CRITICAL VALUE ALERT  Critical value received:  CO2 41  Date of notification:  12/19/2012  Time of notification:  6:42am  Critical value read back:yes  Nurse who received alert:  Blair Heys  MD notified (1st page):  Dr. Orvan Falconer  Time of first page:  6:42am  MD notified (2nd page): Dr. Irene Limbo  Time of second page: 7:40am  Responding MD:  -  Time MD responded:  -  Status passed on to day nurse Sarah. RN notified of both pages and no new orders.

## 2012-12-19 NOTE — Progress Notes (Signed)
The status of the critical value of CO2 41 and pages to MD's will be passed on the [place], [employer] fund[employer]. Clydie Braun notified that there are no new orders at this time.

## 2012-12-19 NOTE — Progress Notes (Signed)
Subjective: Patient's breathing is better  No CP Objective: Filed Vitals:   12/19/12 0240 12/19/12 0243 12/19/12 0634 12/19/12 0701  BP:   138/79   Pulse:   80   Temp:   97.8 F (36.6 C)   TempSrc:   Oral   Resp:   20   Height:      Weight:   205 lb 6.4 oz (93.169 kg)   SpO2: 95% 95% 92% 95%   Weight change: -1 lb 2.7 oz (-0.531 kg)  Intake/Output Summary (Last 24 hours) at 12/19/12 0902 Last data filed at 12/19/12 0858  Gross per 24 hour  Intake    360 ml  Output   2725 ml  Net  -2365 ml    General: Alert, awake, oriented x3, in no acute distress Neck:  JVP is normal Heart: Regular rate and rhythm, without murmurs, rubs, gallops.  Lungs: Clear to auscultation.  No rales or wheezes. Exemities:  No edema.   Neuro: Grossly intact, nonfocal.   Lab Results: Results for orders placed during the hospital encounter of 12/15/12 (from the past 24 hour(s))  BASIC METABOLIC PANEL     Status: Abnormal   Collection Time    12/19/12  5:25 AM      Result Value Range   Sodium 142  135 - 145 mEq/L   Potassium 4.0  3.5 - 5.1 mEq/L   Chloride 96  96 - 112 mEq/L   CO2 41 (*) 19 - 32 mEq/L   Glucose, Bld 90  70 - 99 mg/dL   BUN 19  6 - 23 mg/dL   Creatinine, Ser 0.98  0.50 - 1.35 mg/dL   Calcium 9.3  8.4 - 11.9 mg/dL   GFR calc non Af Amer 88 (*) >90 mL/min   GFR calc Af Amer >90  >90 mL/min    Studies/Results: @RISRSLT24 @  Medications: reviewed   @PROBHOSP @  1.  CHF, acute systolic.  LVEF 30% by echo 8/4.  Mild MR.   CLinically patient is improved  Has diuresed significantly.  CXR improved.   Would keep on current regimen.   Patient denies history of CP  Most likely nonischemic (probably hypertensive).  WIll be addressed as outpatient with myoview.   Have made Wed 8/20 with K Lawrence at 2:20 PM.  2/  HTN  Adequate control.  3.  HL  LDL was 122  HDL 40  Would treat empirically with lipitor 10 mg  WIll f/u as outpatient.  LOS: 4 days   Dietrich Pates 12/19/2012, 9:02  AM \

## 2012-12-19 NOTE — Discharge Summary (Signed)
Physician Discharge Summary  Bradley Bates:[id number] DOB: [birth year] DOA: 12/15/2012  PCP: No PCP Per Patient  Admit date: 12/15/2012 Discharge date: 12/19/2012  Time spent: 45 minutes  Recommendations for Outpatient Follow-up:  1. Pt has appointment with Joni Reining at [facility] 12/31/12 2. Have encouraged pt to establish PCP  Discharge Diagnoses:  Principal Problem:   Acute respiratory failure Active Problems:   Acute decompensated heart failure   HTN (hypertension)   Bronchitis   Thrombocytopenia   Anemia   Elevated transaminase level   Tobacco abuse   Discharge Condition: stable  Diet recommendation: heart healthy  Filed Weights   12/17/12 0633 12/18/12 0520 12/19/12 0634  Weight: 207 lb 12.8 oz (94.257 kg) 206 lb 9.1 oz (93.7 kg) 205 lb 6.4 oz (93.169 kg)    History of present illness:  Bradley Bates is a very pleasant 67 y.o. male with past medical history that includes hypertension and bronchitis presented to the emergency room 12/15/12 with the chief complaint of shortness of breath and lower extremity edema. He reported worsening shortness of breath over the previous 5 days as well as a gradual increase in lower extremity edema. He stated he'd been diagnosed with bronchitis in the past and has an inhaler which he used but it did not help. Associated symptoms included orthopnea, intermittent nonproductive cough, abdominal distention and worsening dyspnea with exertion. He also reported a 20 pound weight gain over the 3 months. His wife reported that he does not ambulate in the home and had rarely left the home over the previous several weeks due to his inability to breathe. He is not on home oxygen. He remarked that he had not taken his blood pressure medicine for several weeks due to an inability to pay for them. He had no regular primary care provider. He denied chest pain palpitations headache dizziness syncope or near-syncope. He denied abdominal pain nausea  vomiting. He did  report intermittent constipation but said his last bowel movement was 12/14/12. Denied bright red blood  or melena. He denied dysuria hematuria frequency or urgency. Lab work in the emergency room significant for proBNP 8443.0, platelets 141, AST 58 ALT 72. Chest x-ray yielded Cardiomegaly with vascular congestion and basilar alveolar densities, likely reflecting edema. Oxygen saturation level dropped to 81% on room air. EKG yielded sinus rhythm with occasional PVC and left ventricular hypertrophy. He was given 40 of Lasix IV in the emergency department as well as nitroglycerin paste and nitroglycerin drip was started.    Hospital Course:  Acute decompensated heart failure: New onset likely related to uncontrolled hypertension do to medication non-compliance as a result of no finances. Pt admitted to [facility]. He was diuresed with IV lasix BID with only fair results. Lasix increased to TID and pt evaluated by cardiology who added amlodipine, imdur and spironolactone and changed metoprolol to carvedilol as well as  increased lisinopril. He then improved markedly.  Volume status -6L at discharge. Weight 205lbs down from 220lbs on admission. 2-D echo yielded EF 30%. Troponins neg. No complaints of chest pain. On day of discharge repeat chest xray yielded resolved pulmonary edema and stable cardiomegaly. Repeat BNP at discharge 620 from 8443 on admission. His oxygen saturation level on room air at rest 89% and ambulating on room air 85%. He will be discharged with oxygen.   Acute respiratory failure: Likely related to acute decompensated heart failure new-onset in the setting of bronchitis and uncontrolled BP. See above. Pt will be discharged with  home oxygen.   HTN (hypertension) urgency: On admission BP 200/101. Initially started on heparin drip but quickly weaned. BP gradually improved with several medication adjustments and assistance of cardiology. It was noted that tighter BP control given his  depressed systolic function was needed. At discharge BP 138/79.   Elevated transaminase: resolved. Likely related to #1.  Bronchitis: Appears to be at baseline. Continue home meds.  Thrombocytopenia: Resolved. Likely related to acute illness.  Anemia: Mild. No signs and symptoms of active bleeding. Likely related to chronic disease.  Tobacco use: counseled regarding cessation    Procedures:  none  Consultations:  cardiology  Discharge Exam: Filed Vitals:   12/19/12 0634  BP: 138/79  Pulse: 80  Temp: 97.8 F (36.6 C)  Resp: 20    General: well nourished ambulating in room NAD Cardiovascular: RRR No MGR No LE edema Respiratory: normal effort at rest. BS somewhat distant in bases but clear to ausculation bilaterally. No crackles no wheeze Abdomen: soft +BS non-tender to palpation.   Discharge Instructions      Discharge Orders   Future Appointments Provider Department Dept Phone   12/31/2012 2:20 PM Jodelle Gross, NP [facility] [place] [phone]   Future Orders Complete By Expires     Diet - low sodium heart healthy  As directed     Discharge instructions  As directed     Comments:      Please take all medications as prescribed Please keep follow up appointment as scheduled Strongly encourage you to establish with a primary care provider    Increase activity slowly  As directed         Medication List    STOP taking these medications       ibuprofen 200 MG tablet  Commonly known as:  ADVIL,MOTRIN     lisinopril-hydrochlorothiazide 20-12.5 MG per tablet  Commonly known as:  PRINZIDE,ZESTORETIC      TAKE these medications       albuterol 108 (90 BASE) MCG/ACT inhaler  Commonly known as:  PROVENTIL HFA;VENTOLIN HFA  Inhale 2 puffs into the lungs every 6 (six) hours as needed for shortness of breath.     albuterol (2.5 MG/3ML) 0.083% nebulizer solution  Commonly known as:  PROVENTIL  Take 2.5 mg by nebulization every 6 (six) hours as  needed for wheezing.     amLODipine 10 MG tablet  Commonly known as:  NORVASC  Take 1 tablet (10 mg total) by mouth daily.     atorvastatin 10 MG tablet  Commonly known as:  LIPITOR  Take 1 tablet (10 mg total) by mouth daily at 6 PM.     carvedilol 12.5 MG tablet  Commonly known as:  COREG  Take 1 tablet (12.5 mg total) by mouth 2 (two) times daily with a meal.     furosemide 40 MG tablet  Commonly known as:  LASIX  Take 1 tablet (40 mg total) by mouth 2 (two) times daily.     hydrALAZINE 25 MG tablet  Commonly known as:  APRESOLINE  Take 1 tablet (25 mg total) by mouth every 8 (eight) hours.     isosorbide mononitrate 30 MG 24 hr tablet  Commonly known as:  IMDUR  Take 1 tablet (30 mg total) by mouth daily.     lisinopril 40 MG tablet  Commonly known as:  PRINIVIL,ZESTRIL  Take 1 tablet (40 mg total) by mouth daily.     potassium chloride SA 20 MEQ tablet  Commonly known  as:  K-DUR,KLOR-CON  Take 1 tablet (20 mEq total) by mouth daily.     spironolactone 25 MG tablet  Commonly known as:  ALDACTONE  Take 1 tablet (25 mg total) by mouth daily.       No Known Allergies    The results of significant diagnostics from this hospitalization (including imaging, microbiology, ancillary and laboratory) are listed below for reference.    Significant Diagnostic Studies: Dg Chest 2 View  12/19/2012   *RADIOLOGY REPORT*  Clinical Data: 67 year old male with hypertension.  Congestive heart failure.  CHEST - 2 VIEW  Comparison: 12/15/2012 and earlier.  Findings: Interval decreased pulmonary vascular congestion/resolved edema.  Stable lung volumes.  No pneumothorax.  The no definite effusion.  No confluent pulmonary opacity. Stable cardiomegaly and mediastinal contours.  Visualized tracheal air column is within normal limits. Stable levoconvex thoracic scoliosis.  No acute osseous abnormality identified.  IMPRESSION: Resolved pulmonary edema.  Stable cardiomegaly.  No new  cardiopulmonary abnormality.   Original Report Authenticated By: Erskine Speed, M.D.   Dg Chest Portable 1 View  12/15/2012   *RADIOLOGY REPORT*  Clinical Data: CHF with shortness of breath and hypertension  PORTABLE CHEST - 1 VIEW  Comparison: Earlier the same day  Findings: 1029 hours. The cardiopericardial silhouette is enlarged. A pulmonary vascular congestion persists with some bibasilar alveolar opacity. Telemetry leads overlie the chest. Imaged bony structures of the thorax are intact.  IMPRESSION: Cardiomegaly with vascular congestion and basilar alveolar densities, likely reflecting edema.   Original Report Authenticated By: Kennith Center, M.D.    Microbiology: Recent Results (from the past 240 hour(s))  MRSA PCR SCREENING     Status: None   Collection Time    12/15/12  1:30 PM      Result Value Range Status   MRSA by PCR NEGATIVE  NEGATIVE Final   Comment:            The GeneXpert MRSA Assay (FDA     approved for NASAL specimens     only), is one component of a     comprehensive MRSA colonization     surveillance program. It is not     intended to diagnose MRSA     infection nor to guide or     monitor treatment for     MRSA infections.     Labs: Basic Metabolic Panel:  Recent Labs Lab 12/15/12 1027 12/15/12 1541 12/16/12 0443 12/17/12 0456 12/19/12 0525  NA 142 143 144 144 142  K 3.8 3.9 3.8 3.8 4.0  CL 105 103 104 99 96  CO2 31 34* 36* 41* 41*  GLUCOSE 99 147* 97 98 90  BUN 14 13 13 13 19   CREATININE 0.95 1.06 0.86 0.85 0.91  CALCIUM 8.5 8.7 8.7 9.0 9.3  MG  --   --  1.7  --   --   PHOS  --   --  4.8*  --   --    Liver Function Tests:  Recent Labs Lab 12/15/12 1027 12/16/12 0443 12/17/12 0456  AST 58* 44* 29  ALT 72* 68* 53  ALKPHOS 58 58 57  BILITOT 0.4 0.5 0.4  PROT 6.4 6.7 6.7  ALBUMIN 3.3* 3.3* 3.2*   No results found for this basename: LIPASE, AMYLASE,  in the last 168 hours No results found for this basename: AMMONIA,  in the last 168  hours CBC:  Recent Labs Lab 12/15/12 1027 12/16/12 0443  WBC 4.0 4.6  NEUTROABS 2.3  --  HGB 12.4* 13.2  HCT 38.8* 41.6  MCV 84.0 84.7  PLT 141* 150   Cardiac Enzymes:  Recent Labs Lab 12/15/12 1027 12/15/12 1541 12/15/12 2155  TROPONINI <0.30 <0.30 <0.30   BNP: BNP (last 3 results)  Recent Labs  12/15/12 1027 12/19/12 0600  PROBNP 8443.0* 620.7*   CBG: No results found for this basename: GLUCAP,  in the last 168 hours     Signed:  Gwenyth Bender  [facility] 12/19/2012, 10:51 AM

## 2012-12-19 NOTE — Progress Notes (Signed)
Patient's O2 saturation sitting is 89% without oxygen. Ambulating without O2 is 85%. Patient sitting with O2 is 92%.

## 2012-12-19 NOTE — Progress Notes (Signed)
Patient received discharge instructions along with prescriptions and follow up appointments. Patient verbalized understanding of all instructions. Patient was escorted by staff via wheelchair to vehicle. Patient discharged to home in stable condition.

## 2012-12-19 NOTE — Progress Notes (Signed)
UR chart review completed.  

## 2012-12-23 NOTE — Progress Notes (Signed)
UR chart review completed.  

## 2012-12-31 ENCOUNTER — Encounter: Payer: Self-pay | Admitting: Adult Health

## 2012-12-31 ENCOUNTER — Ambulatory Visit (INDEPENDENT_AMBULATORY_CARE_PROVIDER_SITE_OTHER): Payer: 59 | Admitting: Adult Health

## 2012-12-31 VITALS — BP 122/68 | HR 80 | Ht 70.0 in | Wt 209.0 lb

## 2012-12-31 DIAGNOSIS — D649 Anemia, unspecified: Secondary | ICD-10-CM

## 2012-12-31 DIAGNOSIS — I10 Essential (primary) hypertension: Secondary | ICD-10-CM

## 2012-12-31 DIAGNOSIS — I509 Heart failure, unspecified: Secondary | ICD-10-CM

## 2012-12-31 MED ORDER — HYDRALAZINE HCL 25 MG PO TABS: 50.0000 mg | ORAL_TABLET | Freq: Three times a day (TID) | ORAL | Status: DC

## 2012-12-31 NOTE — Assessment & Plan Note (Signed)
CBC will be ordered for evaluation of status.

## 2012-12-31 NOTE — Assessment & Plan Note (Signed)
Blood pressure is slightly elevated for patient with EF of 30%. He is having trouble affording medications. I will d/c the amlodipine and increase hydralazine to 50 mg Q 8 hours. Will slowly titrate up coreg on next visit.

## 2012-12-31 NOTE — Progress Notes (Signed)
HPI: [name] is a 67 year old patient of Dr. Dietrich Pates we are following for ongoing assessment and management of CHF, hypertension, with recent history of admission for same. As a history of medical noncompliance due to his finances. During hospitalization in early August of 2014 the patient was diuresis to 6 L with a weight of 205 pounds on discharge. Echocardiogram demonstrated EF of 30%. Blood pressure was uncontrolled and medications were adjusted with discharge blood pressure of 138/79. He was sent home on amlodipine 10 mg daily Lasix 40 mg twice a day carvedilol 12.5 mg twice a day and hydralazine 25 mg every 8 hours,. Isosorbide 30 mg, lisinopril 40 mg daily aspirin lactone 25 mg daily. He is here for hospital followup.   He comes today with concerns of financial difficulties in affording medications. He remains on O2 via [place]. He is without complaint of fluid retention or edema. No chest pain. He continues some mild deconditioning.       No Known Allergies  Current Outpatient Prescriptions  Medication Sig Dispense Refill  . albuterol (PROVENTIL HFA;VENTOLIN HFA) 108 (90 BASE) MCG/ACT inhaler Inhale 2 puffs into the lungs every 6 (six) hours as needed for shortness of breath.       Marland Kitchen albuterol (PROVENTIL) (2.5 MG/3ML) 0.083% nebulizer solution Take 2.5 mg by nebulization every 6 (six) hours as needed for wheezing.      Marland Kitchen atorvastatin (LIPITOR) 10 MG tablet Take 1 tablet (10 mg total) by mouth daily at 6 PM.  30 tablet  0  . carvedilol (COREG) 12.5 MG tablet Take 1 tablet (12.5 mg total) by mouth 2 (two) times daily with a meal.  30 tablet  0  . furosemide (LASIX) 40 MG tablet Take 1 tablet (40 mg total) by mouth 2 (two) times daily.  30 tablet  0  . hydrALAZINE (APRESOLINE) 25 MG tablet Take 2 tablets (50 mg total) by mouth every 8 (eight) hours.  90 tablet  3  . isosorbide mononitrate (IMDUR) 30 MG 24 hr tablet Take 1 tablet (30 mg total) by mouth daily.  30 tablet  0  . lisinopril  (PRINIVIL,ZESTRIL) 40 MG tablet Take 1 tablet (40 mg total) by mouth daily.  30 tablet  0  . potassium chloride SA (K-DUR,KLOR-CON) 20 MEQ tablet Take 1 tablet (20 mEq total) by mouth daily.  30 tablet  0  . spironolactone (ALDACTONE) 25 MG tablet Take 1 tablet (25 mg total) by mouth daily.  30 tablet  0   No current facility-administered medications for this visit.    Past Medical History  Diagnosis Date  . Hypertension   . Bronchitis     History reviewed. No pertinent past surgical history.  NWG:NFAOZH of systems complete and found to be negative unless listed above  PHYSICAL EXAM BP 122/68  Pulse 80  Ht 5\' 10"  (1.778 m)  Wt 209 lb (94.802 kg)  BMI 29.99 kg/m2  SpO2 93%  General: Well developed, well nourished, in no acute distress, wearing O2. Head: Eyes PERRLA, No xanthomas.   Normal cephalic and atramatic  Lungs: Clear bilaterally to auscultation with some mild bibasilar crackles. Heart: HRRR S1 S2, without MRG.  Pulses are 2+ & equal.            No carotid bruit. No JVD.   Abdomen: Bowel sounds are positive, abdomen soft and non-tender without masses or                  Hernia's noted. Msk:  Back normal, normal gait. Normal strength and tone for age. Extremities: No clubbing, cyanosis or edema.  DP +1 Neuro: Alert and oriented X 3. Psych:  Good affect, responds appropriately  EKG:NSR Lateral T-wave abnormality. Rate of 79 bpm.   ASSESSMENT AND PLAN

## 2012-12-31 NOTE — Assessment & Plan Note (Signed)
He does not appear to be fluid overloaded at this time but has some bibasilar crackles. I will check a Pro-BNP for evaluation of status. He will continue on lasix as directed. BMET will be ordered as well, with use of K-Dur and Spironolactone.

## 2012-12-31 NOTE — Progress Notes (Deleted)
Name: FEDERICK LEVENE    DOB: 19-Jan-1946  Age: 67 y.o.  MR#: [id number]       PCP:  No primary provider on file.      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   VS Filed Vitals:   12/31/12 1438  BP: 122/68  Pulse: 80  Height: 5\' 10"  (1.778 m)  Weight: 209 lb (94.802 kg)  SpO2: 93%    Weights Current Weight  12/31/12 209 lb (94.802 kg)  12/19/12 205 lb 6.4 oz (93.169 kg)  06/09/12 199 lb (90.266 kg)    Blood Pressure  BP Readings from Last 3 Encounters:  12/31/12 122/68  12/19/12 101/63  06/09/12 170/87     Admit date:  (Not on file) Last encounter with RMR:  Visit date not found   Allergy Review of patient's allergies indicates no known allergies.  Current Outpatient Prescriptions  Medication Sig Dispense Refill  . albuterol (PROVENTIL HFA;VENTOLIN HFA) 108 (90 BASE) MCG/ACT inhaler Inhale 2 puffs into the lungs every 6 (six) hours as needed for shortness of breath.       Marland Kitchen albuterol (PROVENTIL) (2.5 MG/3ML) 0.083% nebulizer solution Take 2.5 mg by nebulization every 6 (six) hours as needed for wheezing.      Marland Kitchen amLODipine (NORVASC) 10 MG tablet Take 1 tablet (10 mg total) by mouth daily.  30 tablet  0  . atorvastatin (LIPITOR) 10 MG tablet Take 1 tablet (10 mg total) by mouth daily at 6 PM.  30 tablet  0  . carvedilol (COREG) 12.5 MG tablet Take 1 tablet (12.5 mg total) by mouth 2 (two) times daily with a meal.  30 tablet  0  . furosemide (LASIX) 40 MG tablet Take 1 tablet (40 mg total) by mouth 2 (two) times daily.  30 tablet  0  . hydrALAZINE (APRESOLINE) 25 MG tablet Take 1 tablet (25 mg total) by mouth every 8 (eight) hours.  30 tablet  0  . isosorbide mononitrate (IMDUR) 30 MG 24 hr tablet Take 1 tablet (30 mg total) by mouth daily.  30 tablet  0  . lisinopril (PRINIVIL,ZESTRIL) 40 MG tablet Take 1 tablet (40 mg total) by mouth daily.  30 tablet  0  . potassium chloride SA (K-DUR,KLOR-CON) 20  MEQ tablet Take 1 tablet (20 mEq total) by mouth daily.  30 tablet  0  . spironolactone (ALDACTONE) 25 MG tablet Take 1 tablet (25 mg total) by mouth daily.  30 tablet  0   No current facility-administered medications for this visit.    Discontinued Meds:   There are no discontinued medications.  Patient Active Problem List   Diagnosis Date Noted  . Tobacco abuse 12/16/2012  . Acute respiratory failure 12/15/2012  . Acute decompensated heart failure 12/15/2012  . HTN (hypertension) 12/15/2012  . Thrombocytopenia 12/15/2012  . Anemia 12/15/2012  . Elevated transaminase level 12/15/2012  . Bronchitis     LABS    Component Value Date/Time   NA 142 12/19/2012 0525   NA 144 12/17/2012 0456   NA 144 12/16/2012 0443   K 4.0 12/19/2012 0525   K 3.8 12/17/2012 0456   K 3.8 12/16/2012 0443   CL 96 12/19/2012 0525   CL 99 12/17/2012 0456   CL 104 12/16/2012 0443   CO2 41* 12/19/2012 0525   CO2 41* 12/17/2012 0456   CO2 36* 12/16/2012 0443   GLUCOSE 90  12/19/2012 0525   GLUCOSE 98 12/17/2012 0456   GLUCOSE 97 12/16/2012 0443   BUN 19 12/19/2012 0525   BUN 13 12/17/2012 0456   BUN 13 12/16/2012 0443   CREATININE 0.91 12/19/2012 0525   CREATININE 0.85 12/17/2012 0456   CREATININE 0.86 12/16/2012 0443   CALCIUM 9.3 12/19/2012 0525   CALCIUM 9.0 12/17/2012 0456   CALCIUM 8.7 12/16/2012 0443   GFRNONAA 88* 12/19/2012 0525   GFRNONAA >90 12/17/2012 0456   GFRNONAA 90* 12/16/2012 0443   GFRAA >90 12/19/2012 0525   GFRAA >90 12/17/2012 0456   GFRAA >90 12/16/2012 0443   CMP     Component Value Date/Time   NA 142 12/19/2012 0525   K 4.0 12/19/2012 0525   CL 96 12/19/2012 0525   CO2 41* 12/19/2012 0525   GLUCOSE 90 12/19/2012 0525   BUN 19 12/19/2012 0525   CREATININE 0.91 12/19/2012 0525   CALCIUM 9.3 12/19/2012 0525   PROT 6.7 12/17/2012 0456   ALBUMIN 3.2* 12/17/2012 0456   AST 29 12/17/2012 0456   ALT 53 12/17/2012 0456   ALKPHOS 57 12/17/2012 0456   BILITOT 0.4 12/17/2012 0456   GFRNONAA 88* 12/19/2012 0525   GFRAA >90 12/19/2012 0525        Component Value Date/Time   WBC 4.6 12/16/2012 0443   WBC 4.0 12/15/2012 1027   WBC 3.8* 06/09/2012 1129   HGB 13.2 12/16/2012 0443   HGB 12.4* 12/15/2012 1027   HGB 12.7* 06/09/2012 1129   HCT 41.6 12/16/2012 0443   HCT 38.8* 12/15/2012 1027   HCT 39.1 06/09/2012 1129   MCV 84.7 12/16/2012 0443   MCV 84.0 12/15/2012 1027   MCV 82.7 06/09/2012 1129    Lipid Panel     Component Value Date/Time   CHOL 182 12/18/2012 0454   TRIG 99 12/18/2012 0454   HDL 40 12/18/2012 0454   CHOLHDL 4.6 12/18/2012 0454   VLDL 20 12/18/2012 0454   LDLCALC 122* 12/18/2012 0454    ABG No results found for this basename: phart, pco2, pco2art, po2, po2art, hco3, tco2, acidbasedef, o2sat     No results found for this basename: TSH   BNP (last 3 results)  Recent Labs  12/15/12 1027 12/19/12 0600  PROBNP 8443.0* 620.7*   Cardiac Panel (last 3 results) No results found for this basename: CKTOTAL, CKMB, TROPONINI, RELINDX,  in the last 72 hours  Iron/TIBC/Ferritin No results found for this basename: iron, tibc, ferritin     EKG Orders placed during the hospital encounter of 12/15/12  . EKG     Prior Assessment and Plan Problem List as of 12/31/2012     Cardiovascular and Mediastinum   Acute decompensated heart failure   HTN (hypertension)     Respiratory   Acute respiratory failure   Bronchitis     Hematopoietic and Hemostatic   Thrombocytopenia     Other   Anemia   Elevated transaminase level   Tobacco abuse       Imaging: Dg Chest 2 View  12/19/2012   *RADIOLOGY REPORT*  Clinical Data: 67 year old male with hypertension.  Congestive heart failure.  CHEST - 2 VIEW  Comparison: 12/15/2012 and earlier.  Findings: Interval decreased pulmonary vascular congestion/resolved edema.  Stable lung volumes.  No pneumothorax.  The no definite effusion.  No confluent pulmonary opacity. Stable cardiomegaly and mediastinal contours.  Visualized tracheal air column is within normal limits. Stable levoconvex thoracic  scoliosis.  No acute osseous abnormality identified.  IMPRESSION: Resolved pulmonary  edema.  Stable cardiomegaly.  No new cardiopulmonary abnormality.   Original Report Authenticated By: Erskine Speed, M.D.   Dg Chest Portable 1 View  12/15/2012   *RADIOLOGY REPORT*  Clinical Data: CHF with shortness of breath and hypertension  PORTABLE CHEST - 1 VIEW  Comparison: Earlier the same day  Findings: 1029 hours. The cardiopericardial silhouette is enlarged. A pulmonary vascular congestion persists with some bibasilar alveolar opacity. Telemetry leads overlie the chest. Imaged bony structures of the thorax are intact.  IMPRESSION: Cardiomegaly with vascular congestion and basilar alveolar densities, likely reflecting edema.   Original Report Authenticated By: Kennith Center, M.D.

## 2012-12-31 NOTE — Patient Instructions (Addendum)
Your physician recommends that you schedule a follow-up appointment in: 1 MONTH  Your physician recommends that you return for lab work in: today BMET, CBC  Your physician has recommended you make the following change in your medication:  1. Discontinue Amlodipine 2. Start taking Hydralazine 50 mg every 8 hours

## 2013-01-01 ENCOUNTER — Telehealth: Payer: Self-pay | Admitting: Adult Health

## 2013-01-01 DIAGNOSIS — I10 Essential (primary) hypertension: Secondary | ICD-10-CM

## 2013-01-01 DIAGNOSIS — I509 Heart failure, unspecified: Secondary | ICD-10-CM

## 2013-01-01 LAB — CBC
MCH: 26.5 pg (ref 26.0–34.0)
MCV: 80.9 fL (ref 78.0–100.0)
Platelets: 227 10*3/uL (ref 150–400)
RDW: 14.8 % (ref 11.5–15.5)
WBC: 5 10*3/uL (ref 4.0–10.5)

## 2013-01-01 LAB — BASIC METABOLIC PANEL
Calcium: 9.1 mg/dL (ref 8.4–10.5)
Chloride: 101 mEq/L (ref 96–112)
Creat: 1.13 mg/dL (ref 0.50–1.35)
Sodium: 141 mEq/L (ref 135–145)

## 2013-01-01 MED ORDER — SPIRONOLACTONE 25 MG PO TABS: 25.0000 mg | ORAL_TABLET | Freq: Every day | ORAL | Status: DC

## 2013-01-01 MED ORDER — CARVEDILOL 12.5 MG PO TABS: 12.5000 mg | ORAL_TABLET | Freq: Two times a day (BID) | ORAL | Status: DC

## 2013-01-01 MED ORDER — ATORVASTATIN CALCIUM 10 MG PO TABS: 10.0000 mg | ORAL_TABLET | Freq: Every day | ORAL | Status: DC

## 2013-01-01 MED ORDER — POTASSIUM CHLORIDE CRYS ER 20 MEQ PO TBCR: 20.0000 meq | EXTENDED_RELEASE_TABLET | Freq: Every day | ORAL | Status: DC

## 2013-01-01 MED ORDER — HYDRALAZINE HCL 25 MG PO TABS: 50.0000 mg | ORAL_TABLET | Freq: Three times a day (TID) | ORAL | Status: DC

## 2013-01-01 MED ORDER — LISINOPRIL 40 MG PO TABS: 40.0000 mg | ORAL_TABLET | Freq: Every day | ORAL | Status: DC

## 2013-01-01 MED ORDER — FUROSEMIDE 40 MG PO TABS: 40.0000 mg | ORAL_TABLET | Freq: Two times a day (BID) | ORAL | Status: DC

## 2013-01-01 MED ORDER — ISOSORBIDE MONONITRATE ER 30 MG PO TB24: 30.0000 mg | ORAL_TABLET | Freq: Every day | ORAL | Status: DC

## 2013-01-01 NOTE — Telephone Encounter (Signed)
Medication sent via escribe.  

## 2013-01-01 NOTE — Telephone Encounter (Signed)
Please call Hydralazine to [facility] pharmacy.  Please call patient to find out which one. / tgs

## 2013-01-02 ENCOUNTER — Encounter: Payer: Self-pay | Admitting: *Deleted

## 2013-01-02 NOTE — Progress Notes (Signed)
OK. Needs to find one.

## 2013-02-02 ENCOUNTER — Encounter: Payer: Self-pay | Admitting: Adult Health

## 2013-02-02 ENCOUNTER — Ambulatory Visit (INDEPENDENT_AMBULATORY_CARE_PROVIDER_SITE_OTHER): Payer: 59 | Admitting: Adult Health

## 2013-02-02 VITALS — BP 136/69 | HR 77 | Ht 70.0 in | Wt 213.0 lb

## 2013-02-02 DIAGNOSIS — I509 Heart failure, unspecified: Secondary | ICD-10-CM

## 2013-02-02 DIAGNOSIS — Z720 Tobacco use: Secondary | ICD-10-CM

## 2013-02-02 DIAGNOSIS — I10 Essential (primary) hypertension: Secondary | ICD-10-CM

## 2013-02-02 DIAGNOSIS — F17200 Nicotine dependence, unspecified, uncomplicated: Secondary | ICD-10-CM

## 2013-02-02 MED ORDER — LOVASTATIN 20 MG PO TABS: 20.0000 mg | ORAL_TABLET | Freq: Every day | ORAL | Status: DC

## 2013-02-02 MED ORDER — HYDRALAZINE HCL 25 MG PO TABS: 50.0000 mg | ORAL_TABLET | Freq: Three times a day (TID) | ORAL | Status: DC

## 2013-02-02 MED ORDER — POTASSIUM CHLORIDE CRYS ER 20 MEQ PO TBCR: EXTENDED_RELEASE_TABLET | ORAL | Status: AC

## 2013-02-02 NOTE — Assessment & Plan Note (Signed)
Hydralazine is keeping  BP well controlled. LEE is resolved off of amlodipine. Pt is on a limited income and is only using medications from. [facility] list for $4 plan. I have changed his atorvastatin to lovastatin to allow him to be on $4 list med. I have given him sample of Crestor 5 mg to last one month. He is to wait to take both of these medications to ascertain if myalglia is statin induced.  He is to call us to let us know.

## 2013-02-02 NOTE — Assessment & Plan Note (Addendum)
He is well compenstated currently. Will not change medications at this time. He will follow up with Dr.Branch to be established on next visit. He will decrease potassium to 10 mEq daily. BMET will be completed prior to next appt.

## 2013-02-02 NOTE — Progress Notes (Deleted)
Name: Bradley Bates    DOB: July 10, 1945  Age: 67 y.o.  MR#: [id number]       PCP:  No primary provider on file.      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 Hypertension  . Congestive Heart Failure    VS Filed Vitals:   02/02/13 1311  BP: 136/69  Pulse: 77  Height: 5\' 10"  (1.778 m)  Weight: 213 lb (96.616 kg)    Weights Current Weight  02/02/13 213 lb (96.616 kg)  12/31/12 209 lb (94.802 kg)  12/19/12 205 lb 6.4 oz (93.169 kg)    Blood Pressure  BP Readings from Last 3 Encounters:  02/02/13 136/69  12/31/12 122/68  12/19/12 101/63     Admit date:  (Not on file) Last encounter with RMR:  01/01/2013   Allergy Review of patient's allergies indicates no known allergies.  Current Outpatient Prescriptions  Medication Sig Dispense Refill  . albuterol (PROVENTIL HFA;VENTOLIN HFA) 108 (90 BASE) MCG/ACT inhaler Inhale 2 puffs into the lungs every 6 (six) hours as needed for shortness of breath.       Marland Kitchen albuterol (PROVENTIL) (2.5 MG/3ML) 0.083% nebulizer solution Take 2.5 mg by nebulization every 6 (six) hours as needed for wheezing.      Marland Kitchen atorvastatin (LIPITOR) 10 MG tablet Take 1 tablet (10 mg total) by mouth daily at 6 PM.  30 tablet  6  . carvedilol (COREG) 12.5 MG tablet Take 1 tablet (12.5 mg total) by mouth 2 (two) times daily with a meal.  30 tablet  6  . furosemide (LASIX) 40 MG tablet Take 1 tablet (40 mg total) by mouth 2 (two) times daily.  30 tablet  6  . hydrALAZINE (APRESOLINE) 25 MG tablet Take 2 tablets (50 mg total) by mouth every 8 (eight) hours.  90 tablet  3  . isosorbide mononitrate (IMDUR) 30 MG 24 hr tablet Take 1 tablet (30 mg total) by mouth daily.  30 tablet  6  . lisinopril (PRINIVIL,ZESTRIL) 40 MG tablet Take 1 tablet (40 mg total) by mouth daily.  30 tablet  6  . potassium chloride SA (K-DUR,KLOR-CON) 20 MEQ tablet Take 1 tablet (20 mEq total) by mouth daily.  30  tablet  6  . spironolactone (ALDACTONE) 25 MG tablet Take 1 tablet (25 mg total) by mouth daily.  30 tablet  6   No current facility-administered medications for this visit.    Discontinued Meds:   There are no discontinued medications.  Patient Active Problem List   Diagnosis Date Noted  . Tobacco abuse 12/16/2012  . Acute respiratory failure 12/15/2012  . Acute decompensated heart failure 12/15/2012  . HTN (hypertension) 12/15/2012  . Thrombocytopenia 12/15/2012  . Anemia 12/15/2012  . Elevated transaminase level 12/15/2012  . Bronchitis     LABS    Component Value Date/Time   NA 141 12/31/2012 0944   NA 142 12/19/2012 0525   NA 144 12/17/2012 0456   K 4.7 12/31/2012 0944   K 4.0 12/19/2012 0525   K 3.8 12/17/2012 0456   CL 101 12/31/2012 0944   CL 96 12/19/2012 0525   CL 99 12/17/2012 0456   CO2 33* 12/31/2012 0944   CO2 41* 12/19/2012 0525   CO2 41* 12/17/2012 0456   GLUCOSE 98 12/31/2012 0944   GLUCOSE 90 12/19/2012 0525   GLUCOSE 98 12/17/2012 0456  BUN 22 12/31/2012 0944   BUN 19 12/19/2012 0525   BUN 13 12/17/2012 0456   CREATININE 1.13 12/31/2012 0944   CREATININE 0.91 12/19/2012 0525   CREATININE 0.85 12/17/2012 0456   CREATININE 0.86 12/16/2012 0443   CALCIUM 9.1 12/31/2012 0944   CALCIUM 9.3 12/19/2012 0525   CALCIUM 9.0 12/17/2012 0456   GFRNONAA 88* 12/19/2012 0525   GFRNONAA >90 12/17/2012 0456   GFRNONAA 90* 12/16/2012 0443   GFRAA >90 12/19/2012 0525   GFRAA >90 12/17/2012 0456   GFRAA >90 12/16/2012 0443   CMP     Component Value Date/Time   NA 141 12/31/2012 0944   K 4.7 12/31/2012 0944   CL 101 12/31/2012 0944   CO2 33* 12/31/2012 0944   GLUCOSE 98 12/31/2012 0944   BUN 22 12/31/2012 0944   CREATININE 1.13 12/31/2012 0944   CREATININE 0.91 12/19/2012 0525   CALCIUM 9.1 12/31/2012 0944   PROT 6.7 12/17/2012 0456   ALBUMIN 3.2* 12/17/2012 0456   AST 29 12/17/2012 0456   ALT 53 12/17/2012 0456   ALKPHOS 57 12/17/2012 0456   BILITOT 0.4 12/17/2012 0456   GFRNONAA 88* 12/19/2012 0525   GFRAA >90  12/19/2012 0525       Component Value Date/Time   WBC 5.0 12/31/2012 0944   WBC 4.6 12/16/2012 0443   WBC 4.0 12/15/2012 1027   HGB 12.9* 12/31/2012 0944   HGB 13.2 12/16/2012 0443   HGB 12.4* 12/15/2012 1027   HCT 39.3 12/31/2012 0944   HCT 41.6 12/16/2012 0443   HCT 38.8* 12/15/2012 1027   MCV 80.9 12/31/2012 0944   MCV 84.7 12/16/2012 0443   MCV 84.0 12/15/2012 1027    Lipid Panel     Component Value Date/Time   CHOL 182 12/18/2012 0454   TRIG 99 12/18/2012 0454   HDL 40 12/18/2012 0454   CHOLHDL 4.6 12/18/2012 0454   VLDL 20 12/18/2012 0454   LDLCALC 122* 12/18/2012 0454    ABG No results found for this basename: phart, pco2, pco2art, po2, po2art, hco3, tco2, acidbasedef, o2sat     No results found for this basename: TSH   BNP (last 3 results)  Recent Labs  12/15/12 1027 12/19/12 0600  PROBNP 8443.0* 620.7*   Cardiac Panel (last 3 results) No results found for this basename: CKTOTAL, CKMB, TROPONINI, RELINDX,  in the last 72 hours  Iron/TIBC/Ferritin No results found for this basename: iron, tibc, ferritin     EKG Orders placed in visit on 12/31/12  . EKG 12-LEAD     Prior Assessment and Plan Problem List as of 02/02/2013     Cardiovascular and Mediastinum   Acute decompensated heart failure   Last Assessment & Plan   12/31/2012 Office Visit Written 12/31/2012  4:08 PM by Jodelle Gross, NP     He does not appear to be fluid overloaded at this time but has some bibasilar crackles. I will check a Pro-BNP for evaluation of status. He will continue on lasix as directed. BMET will be ordered as well, with use of K-Dur and Spironolactone.    HTN (hypertension)   Last Assessment & Plan   12/31/2012 Office Visit Written 12/31/2012  4:10 PM by Jodelle Gross, NP     Blood pressure is slightly elevated for patient with EF of 30%. He is having trouble affording medications. I will d/c the amlodipine and increase hydralazine to 50 mg Q 8 hours. Will slowly titrate up coreg on next  visit.  Respiratory   Acute respiratory failure   Bronchitis     Hematopoietic and Hemostatic   Thrombocytopenia     Other   Anemia   Last Assessment & Plan   12/31/2012 Office Visit Written 12/31/2012  4:10 PM by Jodelle Gross, NP     CBC will be ordered for evaluation of status.    Elevated transaminase level   Tobacco abuse       Imaging: No results found.

## 2013-02-02 NOTE — Patient Instructions (Addendum)
Your physician recommends that you schedule a follow-up appointment in:  4 months with Dr Lurena Joiner will receive a reminder letter two months in advance reminding you to call and schedule your appointment. If you don't receive this letter, please contact our office.  Your physician has recommended you make the following change in your medication: 1. Start Lovastatin 20 mg daily 2. Potassium 20 mg take 1/2 tablet daily.  Your physician recommends that you return for lab work in: [employer]

## 2013-02-02 NOTE — Progress Notes (Signed)
HPI: Bradley Bates is a 67 year old patient of Dr. Tenny Craw we are following for ongoing assessment and management of CHF, hypertension, with recent history of admission to [facility] for same. The patient has a history of medical noncompliance due to finances. He was last seen in the office on 12/31/2012 and at that time he was stable. He continues to have difficulty affording his medications.. He was continued on Lasix 40 mg daily, ACE inhibitor, and spironolactone/his amlodipine was discontinued due to LE edema and hydralazine was increased to 50 mg 3 times a day. Plan is to increase Coreg dose if he is able to tolerate it.   He complains of muscle aches and pains in his shoulders and back without recent heavy lifting or carrying. Uncertain if this is related to statin induced myalgias.  No Known Allergies  Current Outpatient Prescriptions  Medication Sig Dispense Refill  . albuterol (PROVENTIL HFA;VENTOLIN HFA) 108 (90 BASE) MCG/ACT inhaler Inhale 2 puffs into the lungs every 6 (six) hours as needed for shortness of breath.       Marland Kitchen albuterol (PROVENTIL) (2.5 MG/3ML) 0.083% nebulizer solution Take 2.5 mg by nebulization every 6 (six) hours as needed for wheezing.      Marland Kitchen atorvastatin (LIPITOR) 10 MG tablet Take 1 tablet (10 mg total) by mouth daily at 6 PM.  30 tablet  6  . carvedilol (COREG) 12.5 MG tablet Take 1 tablet (12.5 mg total) by mouth 2 (two) times daily with a meal.  30 tablet  6  . furosemide (LASIX) 40 MG tablet Take 1 tablet (40 mg total) by mouth 2 (two) times daily.  30 tablet  6  . hydrALAZINE (APRESOLINE) 25 MG tablet Take 2 tablets (50 mg total) by mouth every 8 (eight) hours.  90 tablet  3  . isosorbide mononitrate (IMDUR) 30 MG 24 hr tablet Take 1 tablet (30 mg total) by mouth daily.  30 tablet  6  . lisinopril (PRINIVIL,ZESTRIL) 40 MG tablet Take 1 tablet (40 mg total) by mouth daily.  30 tablet  6  . potassium chloride SA (K-DUR,KLOR-CON) 20 MEQ tablet Take 1 tablet (20  mEq total) by mouth daily.  30 tablet  6  . spironolactone (ALDACTONE) 25 MG tablet Take 1 tablet (25 mg total) by mouth daily.  30 tablet  6   No current facility-administered medications for this visit.    Past Medical History  Diagnosis Date  . Hypertension   . Bronchitis     History reviewed. No pertinent past surgical history.  ZOX:WRUEAV of systems complete and found to be negative unless listed above  PHYSICAL EXAM BP 136/69  Pulse 77  Ht 5\' 10"  (1.778 m)  Wt 213 lb (96.616 kg)  BMI 30.56 kg/m2  General: Well developed, well nourished, in no acute distress Head: Eyes PERRLA, No xanthomas.   Normal cephalic and atramatic  Lungs: Clear bilaterally to auscultation and percussion. Heart: HRRR S1 S2, without MRG.  Pulses are 2+ & equal.            No carotid bruit. No JVD.  No abdominal bruits. No femoral bruits. Abdomen: Bowel sounds are positive, abdomen soft and non-tender without masses or                  Hernia's noted. Msk:  Back normal, normal gait. Normal strength and tone for age. Extremities: No clubbing, cyanosis or edema.  DP +1 Neuro: Alert and oriented X 3. Psych:  Good affect, responds  appropriately    ASSESSMENT AND PLAN

## 2013-02-02 NOTE — Assessment & Plan Note (Signed)
He is advised to stop smoking.

## 2013-04-23 ENCOUNTER — Telehealth: Payer: Self-pay | Admitting: Adult Health

## 2013-04-23 DIAGNOSIS — I10 Essential (primary) hypertension: Secondary | ICD-10-CM

## 2013-04-23 DIAGNOSIS — I509 Heart failure, unspecified: Secondary | ICD-10-CM

## 2013-04-23 MED ORDER — HYDRALAZINE HCL 25 MG PO TABS: 50.0000 mg | ORAL_TABLET | Freq: Three times a day (TID) | ORAL | Status: DC

## 2013-04-23 NOTE — Telephone Encounter (Signed)
Medication sent via escribe.  

## 2013-04-23 NOTE — Telephone Encounter (Signed)
Received fax refill request ° °Rx # [id number] °Medication:  Hydralazine 25 mg tab °Qty 90 °Sig:  Take two tablets by mouth every 8 hours °Physician:  Lawrence  ° ° °

## 2013-05-01 ENCOUNTER — Other Ambulatory Visit: Payer: Self-pay

## 2013-05-01 ENCOUNTER — Telehealth: Payer: Self-pay | Admitting: Adult Health

## 2013-05-01 DIAGNOSIS — I509 Heart failure, unspecified: Secondary | ICD-10-CM

## 2013-05-01 DIAGNOSIS — I10 Essential (primary) hypertension: Secondary | ICD-10-CM

## 2013-05-01 MED ORDER — HYDRALAZINE HCL 25 MG PO TABS: 50.0000 mg | ORAL_TABLET | Freq: Three times a day (TID) | ORAL | Status: DC

## 2013-05-01 NOTE — Telephone Encounter (Signed)
Refill to pharmacy 

## 2013-05-01 NOTE — Telephone Encounter (Signed)
Received fax refill request  Rx # [id number] Medication:  Hydralazine 25 mg tab Qty 90 Sig:  Take two tablets by mouth every 8 hours Physician:  Lyman Bishop

## 2013-05-29 ENCOUNTER — Telehealth: Payer: Self-pay | Admitting: Adult Health

## 2013-05-29 MED ORDER — FUROSEMIDE 40 MG PO TABS: 40.0000 mg | ORAL_TABLET | Freq: Two times a day (BID) | ORAL | Status: DC

## 2013-05-29 MED ORDER — CARVEDILOL 12.5 MG PO TABS: 12.5000 mg | ORAL_TABLET | Freq: Two times a day (BID) | ORAL | Status: DC

## 2013-05-29 NOTE — Telephone Encounter (Signed)
Received fax refill request  Rx # [id number] Medication:  Furosemide 40 mg tab Qty 30 Sig:  Take one tablet by mouth twice daily  Physician:  Purcell Nails

## 2013-05-29 NOTE — Telephone Encounter (Signed)
Received fax refill request  Rx # [id number] Medication:  Carvedilol 12.5 mg tab  Qty 30    Sig:  Take one tablet by mouth daily with a meal  Physician:  Purcell Nails

## 2013-05-29 NOTE — Telephone Encounter (Signed)
Medication sent via escribe.  

## 2013-08-06 ENCOUNTER — Telehealth: Payer: Self-pay

## 2013-08-06 NOTE — Telephone Encounter (Signed)
A lady called for patient to be set up for colonoscopy. She said he was due and not having any problems. You can reach them at [phone]

## 2013-08-10 ENCOUNTER — Other Ambulatory Visit: Payer: Self-pay

## 2013-08-10 DIAGNOSIS — Z1211 Encounter for screening for malignant neoplasm of colon: Secondary | ICD-10-CM

## 2013-08-10 MED ORDER — PEG 3350-KCL-NA BICARB-NACL 420 G PO SOLR: 4000.0000 mL | ORAL | Status: DC

## 2013-08-10 NOTE — Addendum Note (Signed)
Addended by: Everardo All on: 08/10/2013 04:26 PM   Modules accepted: Orders

## 2013-08-10 NOTE — Telephone Encounter (Signed)
Rx sent to pharmacy. Faxed the instructions to the pharmacy so pt can get and review before we are off on Friday.

## 2013-08-10 NOTE — Telephone Encounter (Signed)
Appropriate.

## 2013-08-10 NOTE — Telephone Encounter (Signed)
Gastroenterology Pre-Procedure Review  Request Date: 08/10/2013 Requesting Physician: Karna Dupes, PA  [facility])  PATIENT REVIEW QUESTIONS: The patient responded to the following health history questions as indicated:    1. Diabetes Melitis: no 2. Joint replacements in the past 12 months: no 3. Major health problems in the past 3 months: no 4. Has an artificial valve or MVP: no 5. Has a defibrillator: no 6. Has been advised in past to take antibiotics in advance of a procedure like teeth cleaning: no    MEDICATIONS & ALLERGIES:    Patient reports the following regarding taking any blood thinners:   Plavix? no Aspirin? no Coumadin? no  Patient confirms/reports the following medications:  Current Outpatient Prescriptions  Medication Sig Dispense Refill  . albuterol (PROVENTIL HFA;VENTOLIN HFA) 108 (90 BASE) MCG/ACT inhaler Inhale 2 puffs into the lungs every 6 (six) hours as needed for shortness of breath.       . carvedilol (COREG) 12.5 MG tablet Take 1 tablet (12.5 mg total) by mouth 2 (two) times daily with a meal.  60 tablet  3  . furosemide (LASIX) 40 MG tablet Take 1 tablet (40 mg total) by mouth 2 (two) times daily.  60 tablet  3  . hydrALAZINE (APRESOLINE) 25 MG tablet Take 2 tablets (50 mg total) by mouth every 8 (eight) hours.  120 tablet  6  . isosorbide mononitrate (IMDUR) 30 MG 24 hr tablet Take 1 tablet (30 mg total) by mouth daily.  30 tablet  6  . lisinopril (PRINIVIL,ZESTRIL) 40 MG tablet Take 1 tablet (40 mg total) by mouth daily.  30 tablet  6  . lovastatin (MEVACOR) 20 MG tablet Take 1 tablet (20 mg total) by mouth at bedtime.  30 tablet  3  . potassium chloride SA (K-DUR,KLOR-CON) 20 MEQ tablet Take 1/2 a tablet daily.  30 tablet  6  . spironolactone (ALDACTONE) 25 MG tablet Take 1 tablet (25 mg total) by mouth daily.  30 tablet  6  . albuterol (PROVENTIL) (2.5 MG/3ML) 0.083% nebulizer solution Take 2.5 mg by nebulization every 6 (six) hours as  needed for wheezing.       No current facility-administered medications for this visit.    Patient confirms/reports the following allergies:  No Known Allergies  No orders of the defined types were placed in this encounter.    AUTHORIZATION INFORMATION Primary Insurance:   ID #:   Group #:  Pre-Cert / Auth required: Pre-Cert / Auth #:   Secondary Insurance:   ID #:  Group #:  Pre-Cert / Auth require:  Pre-Cert / Auth #:   SCHEDULE INFORMATION: Procedure has been scheduled as follows:  Date:   08/17/2013             Time:  8:30 AM Location: [facility] Short Stay  This Gastroenterology Pre-Precedure Review Form is being routed to the following provider(s): Barney Drain, MD  Routing to AS to sign off on since Dr. Oneida Alar is out today and pt is scheduled for next Mon and we are off Friday. Pt did request a cheaper prep.

## 2013-08-11 ENCOUNTER — Telehealth: Payer: Self-pay

## 2013-08-11 MED ORDER — PEG-KCL-NACL-NASULF-NA ASC-C 100 G PO SOLR: 1.0000 | Freq: Once | ORAL | Status: DC

## 2013-08-11 NOTE — Telephone Encounter (Signed)
Pharmacy called and asked which prep, and I confirmed the Trilyte. The pt had to have the least expensive prep. They will cancel the Movie Prep.   They received the instructions for the Trilytle prep and it is in the bag for pt.

## 2013-08-11 NOTE — Addendum Note (Signed)
Addended by: Danie Binder on: 08/11/2013 09:56 AM   Modules accepted: Orders

## 2013-08-11 NOTE — Telephone Encounter (Signed)
I called [employer] at [phone] and was told by automation that a PA is not required for screening colonoscopy.

## 2013-08-11 NOTE — Telephone Encounter (Signed)
PLEASE CALL PT.  Rx sent.  

## 2013-08-11 NOTE — Telephone Encounter (Signed)
I called [facility] in [place] and confirmed they received the prescription and the insturctions for the prep. I tried to call pt to let him know that the instructions were at the pharmacy and the VM has not been set up.

## 2013-08-17 ENCOUNTER — Encounter (HOSPITAL_COMMUNITY): Payer: Self-pay | Admitting: *Deleted

## 2013-08-17 ENCOUNTER — Ambulatory Visit (HOSPITAL_COMMUNITY)
Admission: RE | Admit: 2013-08-17 | Discharge: 2013-08-17 | Disposition: A | Discharge status: Home / Self Care | Payer: Medicare HMO | Source: Ambulatory Visit | Attending: Gastroenterology | Admitting: Gastroenterology

## 2013-08-17 ENCOUNTER — Encounter (HOSPITAL_COMMUNITY)
Admission: RE | Disposition: A | Discharge status: Home / Self Care | Payer: Self-pay | Source: Ambulatory Visit | Attending: Gastroenterology

## 2013-08-17 DIAGNOSIS — Z79899 Other long term (current) drug therapy: Secondary | ICD-10-CM | POA: Insufficient documentation

## 2013-08-17 DIAGNOSIS — J449 Chronic obstructive pulmonary disease, unspecified: Secondary | ICD-10-CM | POA: Insufficient documentation

## 2013-08-17 DIAGNOSIS — K621 Rectal polyp: Secondary | ICD-10-CM

## 2013-08-17 DIAGNOSIS — K5730 Diverticulosis of large intestine without perforation or abscess without bleeding: Secondary | ICD-10-CM | POA: Insufficient documentation

## 2013-08-17 DIAGNOSIS — E7800 Pure hypercholesterolemia, unspecified: Secondary | ICD-10-CM | POA: Insufficient documentation

## 2013-08-17 DIAGNOSIS — Z1211 Encounter for screening for malignant neoplasm of colon: Secondary | ICD-10-CM | POA: Insufficient documentation

## 2013-08-17 DIAGNOSIS — D126 Benign neoplasm of colon, unspecified: Secondary | ICD-10-CM | POA: Insufficient documentation

## 2013-08-17 DIAGNOSIS — I10 Essential (primary) hypertension: Secondary | ICD-10-CM | POA: Insufficient documentation

## 2013-08-17 DIAGNOSIS — J4489 Other specified chronic obstructive pulmonary disease: Secondary | ICD-10-CM | POA: Insufficient documentation

## 2013-08-17 DIAGNOSIS — Q438 Other specified congenital malformations of intestine: Secondary | ICD-10-CM | POA: Insufficient documentation

## 2013-08-17 DIAGNOSIS — K620 Anal polyp: Secondary | ICD-10-CM | POA: Insufficient documentation

## 2013-08-17 HISTORY — PX: COLONOSCOPY: SHX5424

## 2013-08-17 HISTORY — DX: Chronic obstructive pulmonary disease, unspecified: J44.9

## 2013-08-17 HISTORY — DX: Shortness of breath: R06.02

## 2013-08-17 HISTORY — DX: Pure hypercholesterolemia, unspecified: E78.00

## 2013-08-17 HISTORY — DX: Heart failure, unspecified: I50.9

## 2013-08-17 SURGERY — COLONOSCOPY
Anesthesia: Moderate Sedation

## 2013-08-17 MED ORDER — SODIUM CHLORIDE 0.9 % IV SOLN
INTRAVENOUS | Status: DC
  Administered 2013-08-17: 08:00:00 via INTRAVENOUS

## 2013-08-17 MED ORDER — MIDAZOLAM HCL 5 MG/5ML IJ SOLN
INTRAMUSCULAR | Status: DC | PRN
Start: 2013-08-17 — End: 2013-08-17
  Administered 2013-08-17: 1 mg via INTRAVENOUS
  Administered 2013-08-17: 2 mg via INTRAVENOUS

## 2013-08-17 MED ORDER — MEPERIDINE HCL 100 MG/ML IJ SOLN
INTRAMUSCULAR | Status: AC
  Filled 2013-08-17: qty 2

## 2013-08-17 MED ORDER — MIDAZOLAM HCL 5 MG/5ML IJ SOLN
INTRAMUSCULAR | Status: AC
  Filled 2013-08-17: qty 10

## 2013-08-17 MED ORDER — MEPERIDINE HCL 100 MG/ML IJ SOLN
INTRAMUSCULAR | Status: DC | PRN
  Administered 2013-08-17: 25 mg via INTRAVENOUS

## 2013-08-17 NOTE — H&P (Signed)
Primary Care Physician:  Mackey [facility]wood Primary Gastroenterologist:  Dr. Oneida Alar  Pre-Procedure History & Physical: HPI:  Bradley Bates is a 68 y.o. male here for [place].  Past Medical History  Diagnosis Date  . Hypertension   . Bronchitis   . Hypercholesteremia   . Congestive heart failure (CHF)   . Shortness of breath   . COPD (chronic obstructive pulmonary disease)     Past Surgical History  Procedure Laterality Date  . No past surgeries      Prior to Admission medications   Medication Sig Start Date End Date Taking? Authorizing Provider  albuterol (PROVENTIL HFA;VENTOLIN HFA) 108 (90 BASE) MCG/ACT inhaler Inhale 2 puffs into the lungs every 6 (six) hours as needed for shortness of breath.    Yes Historical Provider, MD  albuterol (PROVENTIL) (2.5 MG/3ML) 0.083% nebulizer solution Take 2.5 mg by nebulization every 6 (six) hours as needed for wheezing.   Yes Historical Provider, MD  carvedilol (COREG) 12.5 MG tablet Take 1 tablet (12.5 mg total) by mouth 2 (two) times daily with a meal. 05/29/13  Yes Lendon Colonel, NP  furosemide (LASIX) 40 MG tablet Take 1 tablet (40 mg total) by mouth 2 (two) times daily. 05/29/13  Yes Lendon Colonel, NP  hydrALAZINE (APRESOLINE) 25 MG tablet Take 2 tablets (50 mg total) by mouth every 8 (eight) hours. 05/01/13  Yes Lendon Colonel, NP  isosorbide mononitrate (IMDUR) 30 MG 24 hr tablet Take 1 tablet (30 mg total) by mouth daily. 01/01/13  Yes Lendon Colonel, NP  lisinopril (PRINIVIL,ZESTRIL) 40 MG tablet Take 1 tablet (40 mg total) by mouth daily. 01/01/13  Yes Lendon Colonel, NP  lovastatin (MEVACOR) 20 MG tablet Take 1 tablet (20 mg total) by mouth at bedtime. 02/02/13  Yes Lendon Colonel, NP  polyethylene glycol-electrolytes (TRILYTE) 420 G solution Take 4,000 mLs by mouth as directed. 08/10/13  Yes Danie Binder, MD  potassium chloride SA (K-DUR,KLOR-CON) 20 MEQ tablet Take 1/2 a tablet daily.  02/02/13  Yes Lendon Colonel, NP  spironolactone (ALDACTONE) 25 MG tablet Take 1 tablet (25 mg total) by mouth daily. 01/01/13  Yes Lendon Colonel, NP           Allergies as of 08/10/2013  . (No Known Allergies)    Family History  Problem Relation Age of Onset  . Diabetes Brother   . Lung cancer Mother   . Cancer Brother   . Colon cancer Neg Hx     History   Social History  . Marital Status: Married    Spouse Name: N/A    Number of Children: N/A  . Years of Education: N/A   Occupational History  . Not on file.   Social History Main Topics  . Smoking status: Current Every Day Smoker -- 1.00 packs/day for 35 years    Types: Cigarettes  . Smokeless tobacco: Never Used  . Alcohol Use: No  . Drug Use: No  . Sexual Activity: Yes   Other Topics Concern  . Not on file   Social History Narrative  . No narrative on file    Review of Systems: See HPI, otherwise negative ROS   Physical Exam: BP 166/88  Pulse 74  Temp(Src) 97.5 F (36.4 C) (Oral)  Resp 23  Ht 5' 10.5" (1.791 m)  Wt 220 lb (99.791 kg)  BMI 31.11 kg/m2  SpO2 94% General:   Alert,  pleasant and cooperative in NAD  Head:  Normocephalic and atraumatic. Neck:  Supple; Lungs:  Clear throughout to auscultation.    Heart:  Regular rate and rhythm. Abdomen:  Soft, nontender and nondistended. Normal bowel sounds, without guarding, and without rebound.   Neurologic:  Alert and  oriented x4;  grossly normal neurologically.  Impression/Plan:     SCREENING  Plan:  1. TCS TODAY

## 2013-08-17 NOTE — Discharge Instructions (Signed)
You had 2 small polyps removed. You have small diverticulosis IN YOUR LEFT COLON.   FOLLOW A HIGH FIBER DIET. AVOID ITEMS THAT CAUSE BLOATING. SEE INFO BELOW.  YOUR BIOPSY RESULTS SHOULD BE BACK IN 7 DAYS.  Next colonoscopy in 10 years.   Colonoscopy Care After Read the instructions outlined below and refer to this sheet in the next week. These discharge instructions provide you with general information on caring for yourself after you leave the hospital. While your treatment has been planned according to the most current medical practices available, unavoidable complications occasionally occur. If you have any problems or questions after discharge, call DR. Zema Lizardo, [phone].  ACTIVITY  You may resume your regular activity, but move at a slower pace for the next 24 hours.   Take frequent rest periods for the next 24 hours.   Walking will help get rid of the air and reduce the bloated feeling in your belly (abdomen).   No driving for 24 hours (because of the medicine (anesthesia) used during the test).   You may shower.   Do not sign any important legal documents or operate any machinery for 24 hours (because of the anesthesia used during the test).    NUTRITION  Drink plenty of fluids.   You may resume your normal diet as instructed by your doctor.   Begin with a light meal and progress to your normal diet. Heavy or fried foods are harder to digest and may make you feel sick to your stomach (nauseated).   Avoid alcoholic beverages for 24 hours or as instructed.    MEDICATIONS  You may resume your normal medications.   WHAT YOU CAN EXPECT TODAY  Some feelings of bloating in the abdomen.   Passage of more gas than usual.   Spotting of blood in your stool or on the toilet paper  .  IF YOU HAD POLYPS REMOVED DURING THE COLONOSCOPY:  Eat a soft diet IF YOU HAVE NAUSEA, BLOATING, ABDOMINAL PAIN, OR VOMITING.    FINDING OUT THE RESULTS OF YOUR TEST Not all  test results are available during your visit. DR. Oneida Alar WILL CALL YOU WITHIN 7 DAYS OF YOUR PROCEDUE WITH YOUR RESULTS. Do not assume everything is normal if you have not heard from DR. Jasmane Brockway IN ONE WEEK, CALL HER OFFICE AT [phone].  SEEK IMMEDIATE MEDICAL ATTENTION AND CALL THE OFFICE: [phone] IF:  You have more than a spotting of blood in your stool.   Your belly is swollen (abdominal distention).   You are nauseated or vomiting.   You have a temperature over 101F.   You have abdominal pain or discomfort that is severe or gets worse throughout the day.  Polyps, Colon  A polyp is extra tissue that grows inside your body. Colon polyps grow in the large intestine. The large intestine, also called the colon, is part of your digestive system. It is a long, hollow tube at the end of your digestive tract where your body makes and stores stool. Most polyps are not dangerous. They are benign. This means they are not cancerous. But over time, some types of polyps can turn into cancer. Polyps that are smaller than a pea are usually not harmful. But larger polyps could someday become or may already be cancerous. To be safe, doctors remove all polyps and test them.   WHO GETS POLYPS? Anyone can get polyps, but certain people are more likely than others. You may have a greater chance of getting polyps if:  You are over 50.   You have had polyps before.   Someone in your family has had polyps.   Someone in your family has had cancer of the large intestine.   Find out if someone in your family has had polyps. You may also be more likely to get polyps if you:   Eat a lot of fatty foods   Smoke   Drink alcohol   Do not exercise  Eat too much   TREATMENT  The caregiver will remove the polyp during sigmoidoscopy or colonoscopy.  PREVENTION There is not one sure way to prevent polyps. You might be able to lower your risk of getting them if you:  Eat more fruits and vegetables and  less fatty food.   Do not smoke.   Avoid alcohol.   Exercise every day.   Lose weight if you are overweight.   Eating more calcium and folate can also lower your risk of getting polyps. Some foods that are rich in calcium are milk, cheese, and broccoli. Some foods that are rich in folate are chickpeas, kidney beans, and spinach.   High-Fiber Diet A high-fiber diet changes your normal diet to include more whole grains, legumes, fruits, and vegetables. Changes in the diet involve replacing refined carbohydrates with unrefined foods. The calorie level of the diet is essentially unchanged. The Dietary Reference Intake (recommended amount) for adult males is 38 grams per day. For adult females, it is 25 grams per day. Pregnant and lactating women should consume 28 grams of fiber per day. Fiber is the intact part of a plant that is not broken down during digestion. Functional fiber is fiber that has been isolated from the plant to provide a beneficial effect in the body. PURPOSE  Increase stool bulk.   Ease and regulate bowel movements.   Lower cholesterol.  INDICATIONS THAT YOU NEED MORE FIBER  Constipation and hemorrhoids.   Uncomplicated diverticulosis (intestine condition) and irritable bowel syndrome.   Weight management.   As a protective measure against hardening of the arteries (atherosclerosis), diabetes, and cancer.   GUIDELINES FOR INCREASING FIBER IN THE DIET  Start adding fiber to the diet slowly. A gradual increase of about 5 more grams (2 slices of whole-wheat bread, 2 servings of most fruits or vegetables, or 1 bowl of high-fiber cereal) per day is best. Too rapid an increase in fiber may result in constipation, flatulence, and bloating.   Drink enough water and fluids to keep your urine clear or pale yellow. Water, juice, or caffeine-free drinks are recommended. Not drinking enough fluid may cause constipation.   Eat a variety of high-fiber foods rather than one type  of fiber.   Try to increase your intake of fiber through using high-fiber foods rather than fiber pills or supplements that contain small amounts of fiber.   The goal is to change the types of food eaten. Do not supplement your present diet with high-fiber foods, but replace foods in your present diet.  INCLUDE A VARIETY OF FIBER SOURCES  Replace refined and processed grains with whole grains, canned fruits with fresh fruits, and incorporate other fiber sources. White rice, white breads, and most bakery goods contain little or no fiber.   Brown whole-grain rice, buckwheat oats, and many fruits and vegetables are all good sources of fiber. These include: broccoli, Brussels sprouts, cabbage, cauliflower, beets, sweet potatoes, white potatoes (skin on), carrots, tomatoes, eggplant, squash, berries, fresh fruits, and dried fruits.   Cereals appear to be  the richest source of fiber. Cereal fiber is found in whole grains and bran. Bran is the fiber-rich outer coat of cereal grain, which is largely removed in refining. In whole-grain cereals, the bran remains. In breakfast cereals, the largest amount of fiber is found in those with "bran" in their names. The fiber content is sometimes indicated on the label.   You may need to include additional fruits and vegetables each day.   In baking, for 1 cup white flour, you may use the following substitutions:   1 cup whole-wheat flour minus 2 tablespoons.   1/2 cup white flour plus 1/2 cup whole-wheat flour.   Diverticulosis Diverticulosis is a common condition that develops when small pouches (diverticula) form in the wall of the colon. The risk of diverticulosis increases with age. It happens more often in people who eat a low-fiber diet. Most individuals with diverticulosis have no symptoms. Those individuals with symptoms usually experience belly (abdominal) pain, constipation, or loose stools (diarrhea).  HOME CARE INSTRUCTIONS  Increase the amount  of fiber in your diet as directed by your caregiver or dietician. This may reduce symptoms of diverticulosis.   Drink at least 6 to 8 glasses of water each day to prevent constipation.   Try not to strain when you have a bowel movement.   Avoiding nuts and seeds to prevent complications is still an uncertain benefit.       FOODS HAVING HIGH FIBER CONTENT INCLUDE:  Fruits. Apple, peach, pear, tangerine, raisins, prunes.   Vegetables. Brussels sprouts, asparagus, broccoli, cabbage, carrot, cauliflower, romaine lettuce, spinach, summer squash, tomato, winter squash, zucchini.   Starchy Vegetables. Baked beans, kidney beans, lima beans, split peas, lentils, potatoes (with skin).   Grains. Whole wheat bread, brown rice, bran flake cereal, plain oatmeal, white rice, shredded wheat, bran muffins.    SEEK IMMEDIATE MEDICAL CARE IF:  You develop increasing pain or severe bloating.   You have an oral temperature above 101F.   You develop vomiting or bowel movements that are bloody or black.

## 2013-08-18 NOTE — Op Note (Signed)
[facility] [address] [place], [address]   COLONOSCOPY PROCEDURE REPORT  PATIENT: Bradley Bates, Bradley Bates  MR#: [id number] BIRTHDATE: 21-Oct-1945 , 25  yrs. old GENDER: Male ENDOSCOPIST: Barney Drain, MD REFERRED KF:[id number] Alford Highland, PA-C PROCEDURE DATE:  08/17/2013 PROCEDURE:   Colonoscopy with biopsy and Colonoscopy with control of bleeding INDICATIONS:Average risk patient for colon cancer. MEDICATIONS: Demerol 25 mg IV and Versed 3 mg IV  DESCRIPTION OF PROCEDURE:    Physical exam was performed.  Informed consent was obtained from the patient after explaining the benefits, risks, and alternatives to procedure.  The patient was connected to monitor and placed in left lateral position. Continuous oxygen was provided by nasal cannula and IV medicine administered through an indwelling cannula.  After administration of sedation and rectal exam, the patients rectum was intubated and the EC-[id number] ([id number])  colonoscope was advanced under direct visualization to the cecum.  The scope was removed slowly by carefully examining the color, texture, anatomy, and integrity mucosa on the way out.  The patient was recovered in endoscopy and discharged home in satisfactory condition.    COLON FINDINGS: Mild diverticulosis was noted in the transverse colon.  , Moderate diverticulosis was noted in the descending colon and sigmoid colon.  , Two sessile polyps ranging between 3-55mm in size were found in the ascending colon(21mm) and rectum(5).  A polypectomy was performed with cold forceps.  Bleeding at the site was controlled using 2 hemoclips.  , and The colon IS redundant. The patient was moved on to their back to reach the cecum.  Manual abdominal counter-pressure was used to reach the cecum.  PREP QUALITY: good.  CECAL W/D TIME: 21 minutes     COMPLICATIONS: None  ENDOSCOPIC IMPRESSION: 1.   Mild diverticulosis was noted in the transverse colon 2.   Moderate diverticulosis  was noted in the descending colon and sigmoid colon 3.   Two colon polyps removed. RECTAL polyp actively bled after removal. HEMOSTASIS ACHIVED. 4.   The LEFT colon IS Redundant   RECOMMENDATIONS: FOLLOW A HIGH FIBER DIET.  AVOID ITEMS THAT CAUSE BLOATING. BIOPSY RESULTS SHOULD BE BACK IN 7 DAYS. NO MRI FOR 30 DAYS. Next colonoscopy in 10 years with an overtube.     _______________________________ eSigned:  Barney Drain, MD 08/18/2013 8:26 AM

## 2013-08-21 ENCOUNTER — Ambulatory Visit (INDEPENDENT_AMBULATORY_CARE_PROVIDER_SITE_OTHER): Payer: Medicare HMO | Admitting: Cardiology

## 2013-08-21 ENCOUNTER — Encounter: Payer: Self-pay | Admitting: Cardiology

## 2013-08-21 VITALS — BP 118/62 | HR 68 | Ht 70.5 in | Wt 233.1 lb

## 2013-08-21 DIAGNOSIS — I5022 Chronic systolic (congestive) heart failure: Secondary | ICD-10-CM

## 2013-08-21 DIAGNOSIS — I509 Heart failure, unspecified: Secondary | ICD-10-CM

## 2013-08-21 MED ORDER — CARVEDILOL 25 MG PO TABS: 25.0000 mg | ORAL_TABLET | Freq: Two times a day (BID) | ORAL | Status: DC

## 2013-08-21 NOTE — Progress Notes (Signed)
Clinical Summary Bradley Bates is a 68 y.o.male last seen by NP Purcell Nails, this is our first visit together. He is seen for the following medical problems. This is a focused visit on his chronic systolic HF  1. Chronic systolic heart failure - echo 12/2012 LVEF 30% - from the notes appears this was newly diagnosed in 12/2012 when the patient presented volume overloaded. Thought to be hypertensive CM, however I do not see that he has had any form of ischemic evaluation - DOE at 3-4 blocks, no orthopnea, no PND, no LE edema - limiting sodium, takes aleve sporadically.  - has had issues with medication compliance before due to cost. States since since he started medicare he has been able to get meds and is more compliant.   Past Medical History  Diagnosis Date  . Hypertension   . Bronchitis   . Hypercholesteremia   . Congestive heart failure (CHF)   . Shortness of breath   . COPD (chronic obstructive pulmonary disease)      No Known Allergies   Current Outpatient Prescriptions  Medication Sig Dispense Refill  . albuterol (PROVENTIL HFA;VENTOLIN HFA) 108 (90 BASE) MCG/ACT inhaler Inhale 2 puffs into the lungs every 6 (six) hours as needed for shortness of breath.       Marland Kitchen albuterol (PROVENTIL) (2.5 MG/3ML) 0.083% nebulizer solution Take 2.5 mg by nebulization every 6 (six) hours as needed for wheezing.      . carvedilol (COREG) 12.5 MG tablet Take 1 tablet (12.5 mg total) by mouth 2 (two) times daily with a meal.  60 tablet  3  . furosemide (LASIX) 40 MG tablet Take 1 tablet (40 mg total) by mouth 2 (two) times daily.  60 tablet  3  . hydrALAZINE (APRESOLINE) 25 MG tablet Take 2 tablets (50 mg total) by mouth every 8 (eight) hours.  120 tablet  6  . isosorbide mononitrate (IMDUR) 30 MG 24 hr tablet Take 1 tablet (30 mg total) by mouth daily.  30 tablet  6  . lisinopril (PRINIVIL,ZESTRIL) 40 MG tablet Take 1 tablet (40 mg total) by mouth daily.  30 tablet  6  . lovastatin (MEVACOR) 20 MG  tablet Take 1 tablet (20 mg total) by mouth at bedtime.  30 tablet  3  . potassium chloride SA (K-DUR,KLOR-CON) 20 MEQ tablet Take 1/2 a tablet daily.  30 tablet  6  . spironolactone (ALDACTONE) 25 MG tablet Take 1 tablet (25 mg total) by mouth daily.  30 tablet  6   No current facility-administered medications for this visit.     Past Surgical History  Procedure Laterality Date  . No past surgeries       No Known Allergies    Family History  Problem Relation Age of Onset  . Diabetes Brother   . Lung cancer Mother   . Cancer Brother   . Colon cancer Neg Hx      Social History Bradley Bates reports that he has been smoking Cigarettes.  He has a 35 pack-year smoking history. He has never used smokeless tobacco. Bradley Bates reports that he does not drink alcohol.   Review of Systems CONSTITUTIONAL: No weight loss, fever, chills, weakness or fatigue.  HEENT: Eyes: No visual loss, blurred vision, double vision or yellow sclerae.No hearing loss, sneezing, congestion, runny nose or sore throat.  SKIN: No rash or itching.  CARDIOVASCULAR: per HPI RESPIRATORY: No shortness of breath, cough or sputum.  GASTROINTESTINAL: No anorexia, nausea, vomiting or  diarrhea. No abdominal pain or blood.  GENITOURINARY: No burning on urination, no polyuria NEUROLOGICAL: No headache, dizziness, syncope, paralysis, ataxia, numbness or tingling in the extremities. No change in bowel or bladder control.  MUSCULOSKELETAL: No muscle, back pain, joint pain or stiffness.  LYMPHATICS: No enlarged nodes. No history of splenectomy.  PSYCHIATRIC: No history of depression or anxiety.  ENDOCRINOLOGIC: No reports of sweating, cold or heat intolerance. No polyuria or polydipsia.  Marland Kitchen   Physical Examination p 68 bp 118/62 Wt 233 lbs BMI 33 Gen: resting comfortably, no acute distress HEENT: no scleral icterus, pupils equal round and reactive, no palptable cervical adenopathy,  CV Resp: Clear to auscultation  bilaterally GI: abdomen is soft, non-tender, non-distended, normal bowel sounds, no hepatosplenomegaly MSK: extremities are warm, no edema.  Skin: warm, no rash Neuro:  no focal deficits Psych: appropriate affect   Diagnostic Studies 12/2012 Echo LVEF 30%, diffuse hypokinesis, mild MR    Assessment and Plan   1. Chronic Systolic Heart Failure - NYHA II-III, LVEF 30% by echo 12/2012, he has not had an ischemic evaluation to this point He appears euvolemic.  - will increase coreg to 25mg  bid - discussed cath in depth today, he is hesitant at this time. Will readdress next visit. Cath is preferred study for him to eval for ischemic etiology  -f/u 1 month for further medication titration.      Arnoldo Lenis, M.D., F.A.C.C.

## 2013-08-21 NOTE — Patient Instructions (Addendum)
Your physician recommends that you schedule a follow-up appointment in: 1 month   Your physician has recommended you make the following change in your medication:    Stop HCTZ  Increase Coreg 25 mg twice a day   Please get lab work (TSH)   Thank you for choosing [facility] !

## 2013-08-22 LAB — TSH: TSH: 0.526 u[IU]/mL (ref 0.350–4.500)

## 2013-09-07 ENCOUNTER — Telehealth: Payer: Self-pay | Admitting: Gastroenterology

## 2013-09-07 NOTE — Telephone Encounter (Signed)
Pt aware of results 

## 2013-09-07 NOTE — Telephone Encounter (Signed)
Please call pt. HE had simple adenomas removed from HIS colon. FOLLOW A HIGH FIBER DIET. TCS IN 5-10 YEARS.

## 2013-09-07 NOTE — Telephone Encounter (Signed)
Reminder in epic °

## 2013-09-28 ENCOUNTER — Ambulatory Visit (INDEPENDENT_AMBULATORY_CARE_PROVIDER_SITE_OTHER): Payer: Medicare HMO | Admitting: Cardiology

## 2013-09-28 VITALS — BP 100/44 | HR 77 | Ht 70.0 in | Wt 220.0 lb

## 2013-09-28 DIAGNOSIS — I5022 Chronic systolic (congestive) heart failure: Secondary | ICD-10-CM

## 2013-09-28 DIAGNOSIS — I509 Heart failure, unspecified: Secondary | ICD-10-CM

## 2013-09-28 NOTE — Progress Notes (Signed)
Clinical Summary Bradley Bates is a 68 y.o.male seen today for follow up of the following medical problems.  1. Chronic systolic heart failure  - echo 12/2012 LVEF 30%  - from the notes appears this was newly diagnosed in 12/2012 when the patient presented volume overloaded. Thought to be hypertensive CM, however I do not see that he has had any form of ischemic evaluation - TSH 0.526  - DOE at 3-4 blocks at regular pace, no orthopnea, no PND, no LE edema  - limiting sodium, takes aleve sporadically.  - has had issues with medication compliance before due to cost. States since since he started medicare he has been able to get meds and is more compliant.   - last visit increased coreg to 25mg  bid, denies any significant side effects.   Past Medical History  Diagnosis Date  . Hypertension   . Bronchitis   . Hypercholesteremia   . Congestive heart failure (CHF)   . Shortness of breath   . COPD (chronic obstructive pulmonary disease)      No Known Allergies   Current Outpatient Prescriptions  Medication Sig Dispense Refill  . albuterol (PROVENTIL HFA;VENTOLIN HFA) 108 (90 BASE) MCG/ACT inhaler Inhale 2 puffs into the lungs every 6 (six) hours as needed for shortness of breath.       Marland Kitchen albuterol (PROVENTIL) (2.5 MG/3ML) 0.083% nebulizer solution Take 2.5 mg by nebulization every 6 (six) hours as needed for wheezing.      . carvedilol (COREG) 25 MG tablet Take 1 tablet (25 mg total) by mouth 2 (two) times daily.  180 tablet  3  . citalopram (CELEXA) 40 MG tablet Take 40 mg by mouth daily.      . furosemide (LASIX) 40 MG tablet Take 1 tablet (40 mg total) by mouth 2 (two) times daily.  60 tablet  3  . hydrALAZINE (APRESOLINE) 25 MG tablet Take 2 tablets (50 mg total) by mouth every 8 (eight) hours.  120 tablet  6  . isosorbide mononitrate (IMDUR) 30 MG 24 hr tablet Take 1 tablet (30 mg total) by mouth daily.  30 tablet  6  . lisinopril (PRINIVIL,ZESTRIL) 40 MG tablet Take 1 tablet (40  mg total) by mouth daily.  30 tablet  6  . lovastatin (MEVACOR) 20 MG tablet Take 1 tablet (20 mg total) by mouth at bedtime.  30 tablet  3  . potassium chloride SA (K-DUR,KLOR-CON) 20 MEQ tablet Take 1/2 a tablet daily.  30 tablet  6  . spironolactone (ALDACTONE) 25 MG tablet Take 1 tablet (25 mg total) by mouth daily.  30 tablet  6   No current facility-administered medications for this visit.     Past Surgical History  Procedure Laterality Date  . No past surgeries    . Colonoscopy N/A 08/17/2013    Procedure: COLONOSCOPY;  Surgeon: Danie Binder, MD;  Location: [facility];  Service: Endoscopy;  Laterality: N/A;  8:30 AM     No Known Allergies    Family History  Problem Relation Age of Onset  . Diabetes Brother   . Lung cancer Mother   . Cancer Brother   . Colon cancer Neg Hx      Social History Mr. Walz reports that he has been smoking Cigarettes.  He has a 35 pack-year smoking history. He has never used smokeless tobacco. Mr. Turko reports that he does not drink alcohol.   Review of Systems CONSTITUTIONAL: No weight loss, fever, chills,  weakness or fatigue.  HEENT: Eyes: No visual loss, blurred vision, double vision or yellow sclerae.No hearing loss, sneezing, congestion, runny nose or sore throat.  SKIN: No rash or itching.  CARDIOVASCULAR:  RESPIRATORY: No shortness of breath, cough or sputum.  GASTROINTESTINAL: No anorexia, nausea, vomiting or diarrhea. No abdominal pain or blood.  GENITOURINARY: No burning on urination, no polyuria NEUROLOGICAL: No headache, dizziness, syncope, paralysis, ataxia, numbness or tingling in the extremities. No change in bowel or bladder control.  MUSCULOSKELETAL: No muscle, back pain, joint pain or stiffness.  LYMPHATICS: No enlarged nodes. No history of splenectomy.  PSYCHIATRIC: No history of depression or anxiety.  ENDOCRINOLOGIC: No reports of sweating, cold or heat intolerance. No polyuria or polydipsia.  Marland Kitchen   Physical  Examination p 77 bp 100/44 Wt 220 lbs BMI 32 Gen: resting comfortably, no acute distress HEENT: no scleral icterus, pupils equal round and reactive, no palptable cervical adenopathy,  CV: RRR, no m/r/g, no JVD, no carotid bruits Resp: Clear to auscultation bilaterally GI: abdomen is soft, non-tender, non-distended, normal bowel sounds, no hepatosplenomegaly MSK: extremities are warm, no edema.  Skin: warm, no rash Neuro:  no focal deficits Psych: appropriate affect   Diagnostic Studies 12/2012 Echo  LVEF 30%, diffuse hypokinesis, mild MR     Assessment and Plan  1. Chronic Systolic Heart Failure  - NYHA II-III, LVEF 30% by echo 12/2012, he has not had an ischemic evaluation to this point. Last visit was our first together, and he was hesitant for a potential cath.  - on optimal dose of meds now, will repeat echo. If LV systolic dysfunction persists will once again recommend cath, if remains hesitant will consider less ideal non-invasive testing - needs ischemic evaluation prior and repeat echo prior to any ICD consideration  F/u 3 months      Arnoldo Lenis, M.D., F.A.C.C.

## 2013-09-28 NOTE — Patient Instructions (Signed)
Your physician recommends that you schedule a follow-up appointment in: 3 months. Your physician recommends that you continue on your current medications as directed. Please refer to the Current Medication list given to you today. Your physician has requested that you have an echocardiogram. Echocardiography is a painless test that uses sound waves to create images of your heart. It provides your doctor with information about the size and shape of your heart and how well your heart's chambers and valves are working. This procedure takes approximately one hour. There are no restrictions for this procedure.  

## 2013-10-07 ENCOUNTER — Ambulatory Visit (HOSPITAL_COMMUNITY)
Admission: RE | Admit: 2013-10-07 | Discharge: 2013-10-07 | Disposition: A | Discharge status: Home / Self Care | Payer: Medicare HMO | Source: Ambulatory Visit | Attending: Cardiology | Admitting: Cardiology

## 2013-10-07 DIAGNOSIS — E785 Hyperlipidemia, unspecified: Secondary | ICD-10-CM | POA: Insufficient documentation

## 2013-10-07 DIAGNOSIS — J449 Chronic obstructive pulmonary disease, unspecified: Secondary | ICD-10-CM | POA: Insufficient documentation

## 2013-10-07 DIAGNOSIS — I509 Heart failure, unspecified: Secondary | ICD-10-CM | POA: Insufficient documentation

## 2013-10-07 DIAGNOSIS — R0609 Other forms of dyspnea: Secondary | ICD-10-CM | POA: Insufficient documentation

## 2013-10-07 DIAGNOSIS — J4489 Other specified chronic obstructive pulmonary disease: Secondary | ICD-10-CM | POA: Insufficient documentation

## 2013-10-07 DIAGNOSIS — I359 Nonrheumatic aortic valve disorder, unspecified: Secondary | ICD-10-CM

## 2013-10-07 DIAGNOSIS — F17200 Nicotine dependence, unspecified, uncomplicated: Secondary | ICD-10-CM | POA: Insufficient documentation

## 2013-10-07 DIAGNOSIS — R0989 Other specified symptoms and signs involving the circulatory and respiratory systems: Secondary | ICD-10-CM | POA: Insufficient documentation

## 2013-10-07 DIAGNOSIS — I10 Essential (primary) hypertension: Secondary | ICD-10-CM | POA: Insufficient documentation

## 2013-10-07 NOTE — Progress Notes (Signed)
  Echocardiogram 2D Echocardiogram has been performed.  Bradley Bates 10/07/2013, 10:13 AM

## 2013-10-13 ENCOUNTER — Telehealth: Payer: Self-pay | Admitting: Adult Health

## 2013-10-13 DIAGNOSIS — I509 Heart failure, unspecified: Secondary | ICD-10-CM

## 2013-10-13 MED ORDER — LISINOPRIL 40 MG PO TABS: 40.0000 mg | ORAL_TABLET | Freq: Every day | ORAL | Status: AC

## 2013-10-13 NOTE — Telephone Encounter (Signed)
Please see paper in refill bin / tgs  °

## 2013-10-13 NOTE — Telephone Encounter (Signed)
Received fax that patient may not be taking medication. Contacted patients wife. Spoke with patients wife, stated Bradley Bates was taking lisinopril.  Sent to pharmacy

## 2013-10-21 ENCOUNTER — Telehealth: Payer: Self-pay | Admitting: Adult Health

## 2013-10-21 MED ORDER — ISOSORBIDE MONONITRATE ER 30 MG PO TB24: 30.0000 mg | ORAL_TABLET | Freq: Every day | ORAL | Status: AC

## 2013-10-21 NOTE — Telephone Encounter (Signed)
Received fax refill request  Rx # [id number] Medication:  Isosorb mono 30 mg  Qty 30 Sig:  Take one tablet by mouth once daily Physician:  Purcell Nails

## 2013-10-21 NOTE — Telephone Encounter (Signed)
Medication sent to pharmacy  

## 2013-11-19 ENCOUNTER — Other Ambulatory Visit: Payer: Self-pay | Admitting: Adult Health

## 2013-12-24 ENCOUNTER — Other Ambulatory Visit: Payer: Self-pay | Admitting: Adult Health

## 2013-12-28 ENCOUNTER — Encounter: Payer: Self-pay | Admitting: Cardiology

## 2013-12-28 ENCOUNTER — Ambulatory Visit (INDEPENDENT_AMBULATORY_CARE_PROVIDER_SITE_OTHER): Payer: Medicare HMO | Admitting: Cardiology

## 2013-12-28 VITALS — BP 102/62 | HR 73 | Ht 70.0 in | Wt 225.0 lb

## 2013-12-28 DIAGNOSIS — I10 Essential (primary) hypertension: Secondary | ICD-10-CM

## 2013-12-28 DIAGNOSIS — R404 Transient alteration of awareness: Secondary | ICD-10-CM

## 2013-12-28 DIAGNOSIS — R400 Somnolence: Secondary | ICD-10-CM

## 2013-12-28 DIAGNOSIS — I5022 Chronic systolic (congestive) heart failure: Secondary | ICD-10-CM

## 2013-12-28 MED ORDER — NICOTINE 14 MG/24HR TD PT24: MEDICATED_PATCH | TRANSDERMAL | Status: DC

## 2013-12-28 MED ORDER — NICOTINE 7 MG/24HR TD PT24: 7.0000 mg | MEDICATED_PATCH | Freq: Every day | TRANSDERMAL | Status: DC

## 2013-12-28 MED ORDER — NICOTINE 21 MG/24HR TD PT24: 21.0000 mg | MEDICATED_PATCH | Freq: Every day | TRANSDERMAL | Status: DC

## 2013-12-28 MED ORDER — ATORVASTATIN CALCIUM 40 MG PO TABS: 40.0000 mg | ORAL_TABLET | Freq: Every day | ORAL | Status: AC

## 2013-12-28 NOTE — Progress Notes (Signed)
Clinical Summary Mr. Markman is a 68 y.o.male seen today for follow up of the following medical problems.   1. Chronic systolic heart failure  - echo 12/2012 LVEF 30%.   - from the notes appears this was newly diagnosed in 12/2012 when the patient presented volume overloaded. Thought to be hypertensive CM, however I do not see that he has had any form of ischemic evaluation  - DOE at 3-4 blocks at regular pace, no orthopnea, no PND, no LE edema  - limiting sodium, takes aleve sporadically.  - has had issues with medication compliance before due to cost. States since since he started medicare he has been able to get meds and is more compliant.   - since last visit had repeat echo 09/2013 which shows now normalized LVEF at 50-55%.   2. HTN - compliant with meds - does not check regularly  3. Hyperlipidemia - compliant with lovastatin - 07/2013 TC 257 HDL 44 LDL 178 TG 175  4. OSA? + snoring, no apneic episodes, + daytime somnolence   5. AAA screening - 68 yo male hx of tobacco abuse - denies any abdominal pain - has not had screening US  6. Tobacco - 1 ppd - has tried nicotine patches in the past with some success.   Past Medical History  Diagnosis Date  . Hypertension   . Bronchitis   . Hypercholesteremia   . Congestive heart failure (CHF)   . Shortness of breath   . COPD (chronic obstructive pulmonary disease)      No Known Allergies   Current Outpatient Prescriptions  Medication Sig Dispense Refill  . albuterol (PROVENTIL HFA;VENTOLIN HFA) 108 (90 BASE) MCG/ACT inhaler Inhale 2 puffs into the lungs every 6 (six) hours as needed for shortness of breath.       Marland Kitchen albuterol (PROVENTIL) (2.5 MG/3ML) 0.083% nebulizer solution Take 2.5 mg by nebulization every 6 (six) hours as needed for wheezing.      . carvedilol (COREG) 25 MG tablet Take 1 tablet (25 mg total) by mouth 2 (two) times daily.  180 tablet  3  . citalopram (CELEXA) 40 MG tablet Take 40 mg by mouth  daily.      . furosemide (LASIX) 40 MG tablet TAKE ONE TABLET BY MOUTH TWICE DAILY *PLEASE CALL AND MAKE APPOINTMENT WITH DR. OFFICE FOR FURTHER REFILLS: [phone]*  60 tablet  3  . hydrALAZINE (APRESOLINE) 25 MG tablet Take 2 tablets (50 mg total) by mouth every 8 (eight) hours.  120 tablet  6  . isosorbide mononitrate (IMDUR) 30 MG 24 hr tablet Take 1 tablet (30 mg total) by mouth daily.  30 tablet  6  . lisinopril (PRINIVIL,ZESTRIL) 40 MG tablet Take 1 tablet (40 mg total) by mouth daily.  90 tablet  3  . lovastatin (MEVACOR) 20 MG tablet TAKE ONE TABLET BY MOUTH ONCE DAILY AT BEDTIME  30 tablet  0  . potassium chloride SA (K-DUR,KLOR-CON) 20 MEQ tablet Take 1/2 a tablet daily.  30 tablet  6  . spironolactone (ALDACTONE) 25 MG tablet TAKE ONE TABLET BY MOUTH ONCE DAILY  30 tablet  3   No current facility-administered medications for this visit.     Past Surgical History  Procedure Laterality Date  . No past surgeries    . Colonoscopy N/A 08/17/2013    Procedure: COLONOSCOPY;  Surgeon: Danie Binder, MD;  Location: [facility];  Service: Endoscopy;  Laterality: N/A;  8:30 AM  No Known Allergies    Family History  Problem Relation Age of Onset  . Diabetes Brother   . Lung cancer Mother   . Cancer Brother   . Colon cancer Neg Hx      Social History Mr. Mayabb reports that he has been smoking Cigarettes.  He has a 35 pack-year smoking history. He has never used smokeless tobacco. Mr. Trembath reports that he does not drink alcohol.   Review of Systems CONSTITUTIONAL: No weight loss, fever, chills, weakness or fatigue.  HEENT: Eyes: No visual loss, blurred vision, double vision or yellow sclerae.No hearing loss, sneezing, congestion, runny nose or sore throat.  SKIN: No rash or itching.  CARDIOVASCULAR: per HPI RESPIRATORY: No shortness of breath, cough or sputum.  GASTROINTESTINAL: No anorexia, nausea, vomiting or diarrhea. No abdominal pain or blood.  GENITOURINARY: No  burning on urination, no polyuria NEUROLOGICAL: No headache, dizziness, syncope, paralysis, ataxia, numbness or tingling in the extremities. No change in bowel or bladder control.  MUSCULOSKELETAL: No muscle, back pain, joint pain or stiffness.  LYMPHATICS: No enlarged nodes. No history of splenectomy.  PSYCHIATRIC: No history of depression or anxiety.  ENDOCRINOLOGIC: No reports of sweating, cold or heat intolerance. No polyuria or polydipsia.  Marland Kitchen   Physical Examination p 73 bp 102/62 Wt 225 lbs BMI 32 Gen: resting comfortably, no acute distress HEENT: no scleral icterus, pupils equal round and reactive, no palptable cervical adenopathy,  CV: RRR, no m/r/g, no JVD, no carotid bruits, no JVD Resp: Clear to auscultation bilaterally GI: abdomen is soft, non-tender, non-distended, normal bowel sounds, no hepatosplenomegaly MSK: extremities are warm, no edema.  Skin: warm, no rash Neuro:  no focal deficits Psych: appropriate affect   Diagnostic Studies 12/2012 Echo  LVEF 30%, diffuse hypokinesis, mild MR  09/2013 Echo Study Conclusions  - Left ventricle: The cavity size was normal. Wall thickness was increased in a pattern of moderate LVH. Systolic function was normal. The estimated ejection fraction was in the range of 50% to 55%. Wall motion was normal; there were no regional wall motion abnormalities. Doppler parameters are consistent with abnormal left ventricular relaxation (grade 1 diastolic dysfunction). - Aortic valve: Mildly calcified annulus. Trileaflet; mildly thickened leaflets. There was mild to moderate regurgitation, peripheral jet. Mean gradient (S): 5 mm Hg. - Mitral valve: Mildly calcified annulus. There was trivial regurgitation. - Left atrium: The atrium was at the upper limits of normal in size. - Right atrium: Central venous pressure (est): 3 mm Hg. - Atrial septum: No defect or patent foramen ovale was identified. - Tricuspid valve: There was trivial  regurgitation. - Pulmonary arteries: PA peak pressure: 20 mm Hg (S). - Pericardium, extracardiac: There was no pericardial effusion.  Impressions:  - Moderate LVH with LVEF 50-55%, significant improvement compared to prior study August 2014. Grade 1 diastolic dysfunction. Upper normal left atrial size. Mildly sclerotic aortic valve with mild to moderate, peripheral aortic regurgitation. Trivial tricuspid regurgitation with PASP 20 mmHg.     Assessment and Plan  1. Chronic Systolic Heart Failure  - NYHA II-III, LVEF 30% by echo 12/2012, now with normalized LVEF by echo 09/2013 with LVEF 50-55%. Appears euvolemic today - continue current meds  2. HTN - at goal, continue current meds  3. Hyperlipidemia - based on current guidleines 19.8% risk of 10 CD event, should be on moderate to high dose statin - change to lipitor 40mg   4. Possible OSA - several risk factors for OSA, will refer for sleep evaluation  5. AAA screening - 68 yo male w/ tobacco history, order AAA [place]  6. Tobacco - advised to quit, counseled on associated health risks - given Rx for nicotine patches F/u 1 year  Arnoldo Lenis, M.D., F.A.C.C.

## 2013-12-28 NOTE — Patient Instructions (Addendum)
Your physician wants you to follow-up in: 1 year You will receive a reminder letter in the mail two months in advance. If you don't receive a letter, please call our office to schedule the follow-up appointment.   Your physician has recommended you make the following change in your medication:    STOP Lovastatin  START Nicotine patches as directed( 21 mg daily patch for 6 weeks,then 14 mg daily patch for 14 days, then 7 mg daily patch for 14 days and then STOP)  START Atorvastatin 40 mg daily   Your physician has requested that you have an abdominal aorta duplex. During this test, an ultrasound is used to evaluate the aorta. Allow 30 minutes for this exam. Do not eat after midnight the day before and avoid carbonated beverages    You have been referred to Dr.Ed Luan Pulling for sleep study

## 2014-01-04 ENCOUNTER — Encounter: Payer: Self-pay | Admitting: *Deleted

## 2014-01-04 ENCOUNTER — Ambulatory Visit (HOSPITAL_COMMUNITY)
Admission: RE | Admit: 2014-01-04 | Discharge: 2014-01-04 | Disposition: A | Discharge status: Home / Self Care | Payer: Medicare HMO | Source: Ambulatory Visit | Attending: Cardiology | Admitting: Cardiology

## 2014-01-04 DIAGNOSIS — I77811 Abdominal aortic ectasia: Secondary | ICD-10-CM | POA: Insufficient documentation

## 2014-01-04 DIAGNOSIS — I10 Essential (primary) hypertension: Secondary | ICD-10-CM

## 2014-01-04 DIAGNOSIS — Z1389 Encounter for screening for other disorder: Secondary | ICD-10-CM | POA: Diagnosis present

## 2014-03-30 ENCOUNTER — Other Ambulatory Visit (HOSPITAL_COMMUNITY): Payer: Self-pay | Admitting: Respiratory Therapy

## 2014-03-30 DIAGNOSIS — G4730 Sleep apnea, unspecified: Secondary | ICD-10-CM

## 2014-04-05 ENCOUNTER — Ambulatory Visit: Payer: Medicare HMO | Attending: Radiology | Admitting: Sleep Medicine

## 2014-04-05 DIAGNOSIS — G4730 Sleep apnea, unspecified: Secondary | ICD-10-CM | POA: Insufficient documentation

## 2014-04-12 ENCOUNTER — Other Ambulatory Visit (HOSPITAL_COMMUNITY): Payer: Self-pay | Admitting: Respiratory Therapy

## 2014-04-12 ENCOUNTER — Ambulatory Visit (HOSPITAL_COMMUNITY)
Admission: RE | Admit: 2014-04-12 | Discharge: 2014-04-12 | Disposition: A | Discharge status: Home / Self Care | Payer: Medicare HMO | Source: Ambulatory Visit | Attending: Pulmonary Disease | Admitting: Pulmonary Disease

## 2014-04-12 DIAGNOSIS — J45909 Unspecified asthma, uncomplicated: Secondary | ICD-10-CM | POA: Diagnosis not present

## 2014-04-12 MED ORDER — ALBUTEROL SULFATE (2.5 MG/3ML) 0.083% IN NEBU
2.5000 mg | INHALATION_SOLUTION | Freq: Once | RESPIRATORY_TRACT | Status: AC
  Administered 2014-04-12: 2.5 mg via RESPIRATORY_TRACT

## 2014-04-13 LAB — PULMONARY FUNCTION TEST
DL/VA % pred: 56 %
DL/VA: 2.59 ml/min/mmHg/L
DLCO COR % PRED: 33 %
DLCO COR: 10.5 ml/min/mmHg
DLCO UNC % PRED: 33 %
DLCO unc: 10.5 ml/min/mmHg
FEF 25-75 Post: 0.41 L/sec
FEF 25-75 Pre: 0.54 L/sec
FEF2575-%CHANGE-POST: -25 %
FEF2575-%PRED-POST: 15 %
FEF2575-%PRED-PRE: 21 %
FEV1-%CHANGE-POST: -11 %
FEV1-%PRED-PRE: 44 %
FEV1-%Pred-Post: 39 %
FEV1-Post: 1.13 L
FEV1-Pre: 1.28 L
FEV1FVC-%Change-Post: -6 %
FEV1FVC-%Pred-Pre: 69 %
FEV6-%Change-Post: -5 %
FEV6-%Pred-Post: 60 %
FEV6-%Pred-Pre: 64 %
FEV6-Post: 2.18 L
FEV6-Pre: 2.3 L
FEV6FVC-%CHANGE-POST: 0 %
FEV6FVC-%PRED-PRE: 101 %
FEV6FVC-%Pred-Post: 101 %
FVC-%CHANGE-POST: -5 %
FVC-%Pred-Post: 60 %
FVC-%Pred-Pre: 63 %
FVC-Post: 2.26 L
FVC-Pre: 2.39 L
POST FEV1/FVC RATIO: 50 %
PRE FEV1/FVC RATIO: 53 %
Post FEV6/FVC ratio: 96 %
Pre FEV6/FVC Ratio: 96 %
RV % pred: 190 %
RV: 4.4 L
TLC % pred: 97 %
TLC: 6.61 L

## 2014-04-17 NOTE — Sleep Study (Signed)
  Bradley A. Merlene Laughter, MD     [email or web address]        NOCTURNAL POLYSOMNOGRAM    LOCATION: SLEEP LAB FACILITY: [facility]   PHYSICIAN: Lindie Roberson A. Merlene Bates, M.D.   DATE OF STUDY: 04/05/2014.   REFERRING PHYSICIAN: Sinda Du.   INDICATIONS: The patient is a 68 year old man who presents with snoring, hypersomnia and fatigue. There is a history of hypertension.  MEDICATIONS:  Prior to Admission medications   Medication Sig Start Date End Date Taking? Authorizing Provider  albuterol (PROVENTIL HFA;VENTOLIN HFA) 108 (90 BASE) MCG/ACT inhaler Inhale 2 puffs into the lungs every 6 (six) hours as needed for shortness of breath.     Historical Provider, MD  albuterol (PROVENTIL) (2.5 MG/3ML) 0.083% nebulizer solution Take 2.5 mg by nebulization every 6 (six) hours as needed for wheezing.    Historical Provider, MD  atorvastatin (LIPITOR) 40 MG tablet Take 1 tablet (40 mg total) by mouth daily. 12/28/13   Arnoldo Lenis, MD  carvedilol (COREG) 25 MG tablet Take 1 tablet (25 mg total) by mouth 2 (two) times daily. 08/21/13   Arnoldo Lenis, MD  furosemide (LASIX) 40 MG tablet TAKE ONE TABLET BY MOUTH TWICE DAILY *PLEASE CALL AND MAKE APPOINTMENT WITH DR. OFFICE FOR FURTHER REFILLS: [phone]* 12/24/13   Arnoldo Lenis, MD  hydrALAZINE (APRESOLINE) 25 MG tablet Take 2 tablets (50 mg total) by mouth every 8 (eight) hours. 05/01/13   Lendon Colonel, NP  isosorbide mononitrate (IMDUR) 30 MG 24 hr tablet Take 1 tablet (30 mg total) by mouth daily. 10/21/13   Lendon Colonel, NP  lisinopril (PRINIVIL,ZESTRIL) 40 MG tablet Take 1 tablet (40 mg total) by mouth daily. 10/13/13   Arnoldo Lenis, MD  nicotine (NICODERM CQ) 14 mg/24hr patch Use 1 patch daily on skin for 14 days 12/28/13   Arnoldo Lenis, MD  nicotine (NICODERM CQ) 21 mg/24hr patch Place 1 patch (21 mg total) onto the skin daily. 12/28/13   Arnoldo Lenis, MD  nicotine (NICODERM CQ) 7 mg/24hr patch  Place 1 patch (7 mg total) onto the skin daily. Use for 2 weeks then stop 12/28/13   Arnoldo Lenis, MD  potassium chloride SA (K-DUR,KLOR-CON) 20 MEQ tablet Take 1/2 a tablet daily. 02/02/13   Lendon Colonel, NP  spironolactone (ALDACTONE) 25 MG tablet TAKE ONE TABLET BY MOUTH ONCE DAILY 11/19/13   Lendon Colonel, NP      EPWORTH SLEEPINESS SCALE: 6.   BMI: 32.   ARCHITECTURAL SUMMARY: Total recording time was 392 minutes. Sleep efficiency 57 %. Sleep latency 9 minutes. REM latency 62 minutes. Stage NI 18 %, N2 72 % and N3 1 % and REM sleep 9 %.    RESPIRATORY DATA:  Baseline oxygen saturation is 95 %. The lowest saturation is 65 %. Total minutes spent below oxygen saturation 88% is 21. The diagnostic AHI is 10. The RDI is 10. The REM AHI is 51.  LIMB MOVEMENT SUMMARY: PLM index 22.   ELECTROCARDIOGRAM SUMMARY: Average heart rate is 67 with no significant dysrhythmias observed.   IMPRESSION:  1. 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 Given the severe desaturation, history of hypertension, hypersomnia and heart failure, a trial of positive pressure is warranted. AutoPap 7-15 is recommended.  Thanks for this referral.  Cleveland Paiz A. Merlene Bates, M.D. Diplomat, Tax adviser of Sleep Medicine.

## 2014-04-24 ENCOUNTER — Other Ambulatory Visit: Payer: Self-pay | Admitting: Adult Health

## 2014-12-19 ENCOUNTER — Emergency Department (HOSPITAL_COMMUNITY): Payer: Medicare HMO

## 2014-12-19 ENCOUNTER — Encounter (HOSPITAL_COMMUNITY): Payer: Self-pay | Admitting: *Deleted

## 2014-12-19 ENCOUNTER — Inpatient Hospital Stay (HOSPITAL_COMMUNITY)
Admission: EM | Admit: 2014-12-19 | Discharge: 2014-12-21 | DRG: 291 | Disposition: A | Discharge status: Home / Self Care | Payer: Medicare HMO | Attending: Internal Medicine | Admitting: Internal Medicine

## 2014-12-19 DIAGNOSIS — Z23 Encounter for immunization: Secondary | ICD-10-CM

## 2014-12-19 DIAGNOSIS — E780 Pure hypercholesterolemia: Secondary | ICD-10-CM | POA: Diagnosis not present

## 2014-12-19 DIAGNOSIS — I5033 Acute on chronic diastolic (congestive) heart failure: Secondary | ICD-10-CM | POA: Diagnosis not present

## 2014-12-19 DIAGNOSIS — Z720 Tobacco use: Secondary | ICD-10-CM | POA: Diagnosis present

## 2014-12-19 DIAGNOSIS — Z79899 Other long term (current) drug therapy: Secondary | ICD-10-CM | POA: Diagnosis not present

## 2014-12-19 DIAGNOSIS — I110 Hypertensive heart disease with heart failure: Secondary | ICD-10-CM | POA: Diagnosis present

## 2014-12-19 DIAGNOSIS — J9602 Acute respiratory failure with hypercapnia: Secondary | ICD-10-CM | POA: Diagnosis present

## 2014-12-19 DIAGNOSIS — J9600 Acute respiratory failure, unspecified whether with hypoxia or hypercapnia: Secondary | ICD-10-CM | POA: Diagnosis not present

## 2014-12-19 DIAGNOSIS — Z9114 Patient's other noncompliance with medication regimen: Secondary | ICD-10-CM | POA: Diagnosis present

## 2014-12-19 DIAGNOSIS — Z9981 Dependence on supplemental oxygen: Secondary | ICD-10-CM | POA: Diagnosis not present

## 2014-12-19 DIAGNOSIS — F17210 Nicotine dependence, cigarettes, uncomplicated: Secondary | ICD-10-CM | POA: Diagnosis present

## 2014-12-19 DIAGNOSIS — J449 Chronic obstructive pulmonary disease, unspecified: Secondary | ICD-10-CM | POA: Diagnosis present

## 2014-12-19 DIAGNOSIS — Z9119 Patient's noncompliance with other medical treatment and regimen: Secondary | ICD-10-CM

## 2014-12-19 DIAGNOSIS — I10 Essential (primary) hypertension: Secondary | ICD-10-CM | POA: Diagnosis present

## 2014-12-19 DIAGNOSIS — I509 Heart failure, unspecified: Secondary | ICD-10-CM

## 2014-12-19 DIAGNOSIS — Z91199 Patient's noncompliance with other medical treatment and regimen due to unspecified reason: Secondary | ICD-10-CM

## 2014-12-19 DIAGNOSIS — I160 Hypertensive urgency: Secondary | ICD-10-CM

## 2014-12-19 LAB — BLOOD GAS, ARTERIAL
ACID-BASE EXCESS: 0.4 mmol/L (ref 0.0–2.0)
ACID-BASE EXCESS: 0.5 mmol/L (ref 0.0–2.0)
BICARBONATE: 29.5 meq/L — AB (ref 20.0–24.0)
BICARBONATE: 28.4 meq/L — AB (ref 20.0–24.0)
DELIVERY SYSTEMS: POSITIVE
Expiratory PAP: 8
FIO2: 100
FIO2: 90
Inspiratory PAP: 17
O2 SAT: 90.2 %
O2 Saturation: 94.3 %
PATIENT TEMPERATURE: 37
PCO2 ART: 81.5 mmHg — AB (ref 35.0–45.0)
PH ART: 7.168 — AB (ref 7.350–7.450)
PO2 ART: 74.6 mmHg — AB (ref 80.0–100.0)
Patient temperature: 37
TCO2: 28.7 mmol/L (ref 0–100)
TCO2: 26.9 mmol/L (ref 0–100)
pCO2 arterial: 102 mmHg (ref 35.0–45.0)
pH, Arterial: 7.091 — CL (ref 7.350–7.450)
pO2, Arterial: 99.8 mmHg (ref 80.0–100.0)

## 2014-12-19 LAB — COMPREHENSIVE METABOLIC PANEL
ALBUMIN: 4.1 g/dL (ref 3.5–5.0)
ALK PHOS: 65 U/L (ref 38–126)
ALT: 16 U/L — ABNORMAL LOW (ref 17–63)
AST: 21 U/L (ref 15–41)
Anion gap: 9 (ref 5–15)
BUN: 22 mg/dL — ABNORMAL HIGH (ref 6–20)
CALCIUM: 8.5 mg/dL — AB (ref 8.9–10.3)
CO2: 27 mmol/L (ref 22–32)
Chloride: 105 mmol/L (ref 101–111)
Creatinine, Ser: 1.28 mg/dL — ABNORMAL HIGH (ref 0.61–1.24)
GFR calc Af Amer: >60 mL/min (ref >60)
GFR calc non Af Amer: 57 mL/min — ABNORMAL LOW (ref >60)
GLUCOSE: 147 mg/dL — AB (ref 65–99)
Potassium: 4.4 mmol/L (ref 3.5–5.1)
Sodium: 141 mmol/L (ref 135–145)
Total Bilirubin: 0.7 mg/dL (ref 0.3–1.2)
Total Protein: 7.9 g/dL (ref 6.5–8.1)

## 2014-12-19 LAB — CBC WITH DIFFERENTIAL/PLATELET
BASOS ABS: 0 10*3/uL (ref 0.0–0.1)
BASOS PCT: 0 % (ref 0–1)
Eosinophils Absolute: 0.1 10*3/uL (ref 0.0–0.7)
Eosinophils Relative: 1 % (ref 0–5)
HEMATOCRIT: 41.7 % (ref 39.0–52.0)
HEMOGLOBIN: 12.9 g/dL — AB (ref 13.0–17.0)
Lymphocytes Relative: 22 % (ref 12–46)
Lymphs Abs: 2.2 10*3/uL (ref 0.7–4.0)
MCH: 26.3 pg (ref 26.0–34.0)
MCHC: 30.9 g/dL (ref 30.0–36.0)
MCV: 85.1 fL (ref 78.0–100.0)
MONO ABS: 0.6 10*3/uL (ref 0.1–1.0)
MONOS PCT: 6 % (ref 3–12)
NEUTROS ABS: 6.9 10*3/uL (ref 1.7–7.7)
Neutrophils Relative %: 71 % (ref 43–77)
Platelets: 158 10*3/uL (ref 150–400)
RBC: 4.9 MIL/uL (ref 4.22–5.81)
RDW: 16.9 % — ABNORMAL HIGH (ref 11.5–15.5)
WBC: 9.8 10*3/uL (ref 4.0–10.5)

## 2014-12-19 LAB — MRSA PCR SCREENING: MRSA by PCR: NEGATIVE

## 2014-12-19 LAB — I-STAT TROPONIN, ED: Troponin i, poc: 0.01 ng/mL (ref 0.00–0.08)

## 2014-12-19 LAB — TROPONIN I
TROPONIN I: 0.06 ng/mL — AB (ref <0.031)
Troponin I: 0.04 ng/mL — ABNORMAL HIGH (ref <0.031)

## 2014-12-19 LAB — TSH: TSH: 0.778 u[IU]/mL (ref 0.350–4.500)

## 2014-12-19 LAB — I-STAT CG4 LACTIC ACID, ED: LACTIC ACID, VENOUS: 0.7 mmol/L (ref 0.5–2.0)

## 2014-12-19 LAB — BRAIN NATRIURETIC PEPTIDE: B Natriuretic Peptide: 565 pg/mL — ABNORMAL HIGH (ref 0.0–100.0)

## 2014-12-19 MED ORDER — CARVEDILOL 12.5 MG PO TABS
25.0000 mg | ORAL_TABLET | Freq: Two times a day (BID) | ORAL | Status: DC
  Administered 2014-12-19 – 2014-12-21 (×4): 25 mg via ORAL
  Filled 2014-12-19 (×4): qty 2

## 2014-12-19 MED ORDER — SUCCINYLCHOLINE CHLORIDE 20 MG/ML IJ SOLN
INTRAMUSCULAR | Status: AC
  Filled 2014-12-19: qty 1

## 2014-12-19 MED ORDER — PNEUMOCOCCAL VAC POLYVALENT 25 MCG/0.5ML IJ INJ
0.5000 mL | INJECTION | INTRAMUSCULAR | Status: AC
  Administered 2014-12-20: 0.5 mL via INTRAMUSCULAR
  Filled 2014-12-19: qty 0.5

## 2014-12-19 MED ORDER — NITROGLYCERIN 0.4 MG SL SUBL
0.4000 mg | SUBLINGUAL_TABLET | SUBLINGUAL | Status: DC | PRN
  Administered 2014-12-19: 0.4 mg via SUBLINGUAL
  Filled 2014-12-19: qty 1

## 2014-12-19 MED ORDER — HYDRALAZINE HCL 25 MG PO TABS
50.0000 mg | ORAL_TABLET | Freq: Three times a day (TID) | ORAL | Status: DC
  Administered 2014-12-19 – 2014-12-20 (×4): 50 mg via ORAL
  Filled 2014-12-19 (×6): qty 2

## 2014-12-19 MED ORDER — ASPIRIN EC 81 MG PO TBEC
81.0000 mg | DELAYED_RELEASE_TABLET | Freq: Every day | ORAL | Status: DC
  Administered 2014-12-19 – 2014-12-21 (×3): 81 mg via ORAL
  Filled 2014-12-19 (×3): qty 1

## 2014-12-19 MED ORDER — ATORVASTATIN CALCIUM 40 MG PO TABS
40.0000 mg | ORAL_TABLET | Freq: Every day | ORAL | Status: DC
  Administered 2014-12-19 – 2014-12-20 (×2): 40 mg via ORAL
  Filled 2014-12-19 (×2): qty 1

## 2014-12-19 MED ORDER — ALBUTEROL SULFATE (2.5 MG/3ML) 0.083% IN NEBU
2.5000 mg | INHALATION_SOLUTION | Freq: Four times a day (QID) | RESPIRATORY_TRACT | Status: DC | PRN
  Administered 2014-12-20: 2.5 mg via RESPIRATORY_TRACT
  Filled 2014-12-19: qty 3

## 2014-12-19 MED ORDER — LIDOCAINE HCL (CARDIAC) 20 MG/ML IV SOLN
INTRAVENOUS | Status: AC
  Filled 2014-12-19: qty 5

## 2014-12-19 MED ORDER — ALBUTEROL (5 MG/ML) CONTINUOUS INHALATION SOLN
10.0000 mg/h | INHALATION_SOLUTION | RESPIRATORY_TRACT | Status: DC
Start: 2014-12-19 — End: 2014-12-19
  Administered 2014-12-19: 10 mg/h via RESPIRATORY_TRACT
  Filled 2014-12-19: qty 20

## 2014-12-19 MED ORDER — FUROSEMIDE 10 MG/ML IJ SOLN
40.0000 mg | Freq: Two times a day (BID) | INTRAMUSCULAR | Status: DC
  Administered 2014-12-19 – 2014-12-21 (×4): 40 mg via INTRAVENOUS
  Filled 2014-12-19 (×4): qty 4

## 2014-12-19 MED ORDER — ROCURONIUM BROMIDE 50 MG/5ML IV SOLN
INTRAVENOUS | Status: DC
Start: 2014-12-19 — End: 2014-12-19
  Filled 2014-12-19: qty 2

## 2014-12-19 MED ORDER — ENOXAPARIN SODIUM 40 MG/0.4ML ~~LOC~~ SOLN
40.0000 mg | SUBCUTANEOUS | Status: DC
  Administered 2014-12-19 – 2014-12-20 (×2): 40 mg via SUBCUTANEOUS
  Filled 2014-12-19 (×2): qty 0.4

## 2014-12-19 MED ORDER — LISINOPRIL 10 MG PO TABS
40.0000 mg | ORAL_TABLET | Freq: Every day | ORAL | Status: DC
  Administered 2014-12-19 – 2014-12-21 (×3): 40 mg via ORAL
  Filled 2014-12-19 (×3): qty 4

## 2014-12-19 MED ORDER — SODIUM CHLORIDE 0.9 % IJ SOLN
3.0000 mL | Freq: Two times a day (BID) | INTRAMUSCULAR | Status: DC
  Administered 2014-12-19 – 2014-12-21 (×4): 3 mL via INTRAVENOUS

## 2014-12-19 MED ORDER — ETOMIDATE 2 MG/ML IV SOLN
INTRAVENOUS | Status: AC
  Filled 2014-12-19: qty 20

## 2014-12-19 MED ORDER — NICOTINE 14 MG/24HR TD PT24
14.0000 mg | MEDICATED_PATCH | Freq: Every day | TRANSDERMAL | Status: DC
  Administered 2014-12-19 – 2014-12-21 (×3): 14 mg via TRANSDERMAL
  Filled 2014-12-19 (×3): qty 1

## 2014-12-19 MED ORDER — FUROSEMIDE 10 MG/ML IJ SOLN
40.0000 mg | Freq: Once | INTRAMUSCULAR | Status: AC
  Administered 2014-12-19: 40 mg via INTRAVENOUS
  Filled 2014-12-19: qty 4

## 2014-12-19 MED ORDER — NITROGLYCERIN IN D5W 200-5 MCG/ML-% IV SOLN
0.0000 ug/min | INTRAVENOUS | Status: DC
  Administered 2014-12-19: 5 ug/min via INTRAVENOUS
  Administered 2014-12-19: 35 ug/min via INTRAVENOUS
  Administered 2014-12-19: 10 ug/min via INTRAVENOUS
  Administered 2014-12-19: 20 ug/min via INTRAVENOUS
  Filled 2014-12-19: qty 250

## 2014-12-19 NOTE — ED Notes (Signed)
Respiratory at bedside.  Pt placed on BiPap.

## 2014-12-19 NOTE — ED Notes (Signed)
Pt gave 300 mL urine output with 1 person assist. 95% on ipap. MD made aware. Pt alert and oriented at this time.

## 2014-12-19 NOTE — ED Provider Notes (Signed)
CSN: [id number]     Arrival date & time 12/19/14  1029 History  This chart was scribed for Quintella Reichert, MD by Hilda Lias, ED Scribe. This patient was seen in room [facility]18/[facility]18 and the patient's care was started at 10:30 AM.    Chief Complaint  Patient presents with  . Respiratory Distress     The history is provided by the patient and the EMS personnel. No language interpreter was used.   HPI Comments: Bradley Bates is a 68 y.o. male with COPD who presents to the Emergency Department complaining of constant, worsening respiratory distress with associated SOB that began yesterday morning. Pt states he began to have trouble breathing when he inhaled air from an air freshener. Pt reports that his breathing was bad yesterday but notes it has worsened since then. Per EMS breathing rate was 35 breaths per minute, initial BP was 210/109, and abdomen distended more than normal. Pt denies fevers, denies chest pain. Level V caveat due to respiratory distress.    Past Medical History  Diagnosis Date  . Hypertension   . Bronchitis   . Hypercholesteremia   . Congestive heart failure (CHF)   . Shortness of breath   . COPD (chronic obstructive pulmonary disease)    Past Surgical History  Procedure Laterality Date  . No past surgeries    . Colonoscopy N/A 08/17/2013    Procedure: COLONOSCOPY;  Surgeon: Danie Binder, MD;  Location: [facility];  Service: Endoscopy;  Laterality: N/A;  8:30 AM   Family History  Problem Relation Age of Onset  . Diabetes Brother   . Lung cancer Mother   . Cancer Brother   . Colon cancer Neg Hx    History  Substance Use Topics  . Smoking status: Current Every Day Smoker -- 1.00 packs/day for 35 years    Types: Cigarettes  . Smokeless tobacco: Never Used  . Alcohol Use: No    Review of Systems  Constitutional: Negative for fever.  Respiratory: Positive for shortness of breath.   All other systems reviewed and are negative.     Allergies  Review of  patient's allergies indicates no known allergies.  Home Medications   Prior to Admission medications   Medication Sig Start Date End Date Taking? Authorizing Provider  atorvastatin (LIPITOR) 40 MG tablet Take 1 tablet (40 mg total) by mouth daily. 12/28/13  Yes Arnoldo Lenis, MD  carvedilol (COREG) 25 MG tablet Take 1 tablet (25 mg total) by mouth 2 (two) times daily. 08/21/13  Yes Arnoldo Lenis, MD  furosemide (LASIX) 40 MG tablet TAKE ONE TABLET BY MOUTH TWICE DAILY *PLEASE CALL AND MAKE APPOINTMENT WITH DR. OFFICE FOR FURTHER REFILLS: [phone]* 12/24/13  Yes Arnoldo Lenis, MD  hydrALAZINE (APRESOLINE) 25 MG tablet Take 2 tablets (50 mg total) by mouth every 8 (eight) hours. 05/01/13  Yes Lendon Colonel, NP  isosorbide mononitrate (IMDUR) 30 MG 24 hr tablet Take 1 tablet (30 mg total) by mouth daily. 10/21/13  Yes Lendon Colonel, NP  lisinopril (PRINIVIL,ZESTRIL) 40 MG tablet Take 1 tablet (40 mg total) by mouth daily. 10/13/13  Yes Arnoldo Lenis, MD  potassium chloride SA (K-DUR,KLOR-CON) 20 MEQ tablet Take 1/2 a tablet daily. Patient taking differently: Take 20 mEq by mouth daily.  02/02/13  Yes Lendon Colonel, NP  spironolactone (ALDACTONE) 25 MG tablet TAKE ONE TABLET BY MOUTH ONCE DAILY 04/26/14  Yes Arnoldo Lenis, MD  albuterol (PROVENTIL HFA;VENTOLIN HFA) 108 (  90 BASE) MCG/ACT inhaler Inhale 2 puffs into the lungs every 6 (six) hours as needed for shortness of breath.     Historical Provider, MD  albuterol (PROVENTIL) (2.5 MG/3ML) 0.083% nebulizer solution Take 2.5 mg by nebulization every 6 (six) hours as needed for wheezing.    Historical Provider, MD  nicotine (NICODERM CQ) 14 mg/24hr patch Use 1 patch daily on skin for 14 days 12/28/13   Arnoldo Lenis, MD  nicotine (NICODERM CQ) 21 mg/24hr patch Place 1 patch (21 mg total) onto the skin daily. 12/28/13   Arnoldo Lenis, MD  nicotine (NICODERM CQ) 7 mg/24hr patch Place 1 patch (7 mg total) onto the skin  daily. Use for 2 weeks then stop 12/28/13   Arnoldo Lenis, MD   BP 146/84 mmHg  Pulse 74  Resp 21  SpO2 92% Physical Exam  Constitutional: He is oriented to person, place, and time. He appears well-developed and well-nourished. He appears distressed.  HENT:  Head: Normocephalic and atraumatic.  Cardiovascular: Normal rate and regular rhythm.   No murmur heard. Pulmonary/Chest: He is in respiratory distress.  Tachypnea, speaks in short sentences. Decreased air movement bilaterally with end expiratory wheezes bilaterally. Bibasilar rales.  Abdominal: Soft. There is no tenderness. There is no rebound and no guarding.  Musculoskeletal: He exhibits no tenderness.  Trace pitting edema bilateral lower extremities  Neurological: He is alert and oriented to person, place, and time.  Skin: Skin is warm and dry.  Psychiatric: He has a normal mood and affect. His behavior is normal.  Nursing note and vitals reviewed.   ED Course  Procedures (including critical care time) CRITICAL CARE Performed by: Quintella Reichert   Total critical care time: 60 minutes  Critical care time was exclusive of separately billable procedures and treating other patients.  Critical care was necessary to treat or prevent imminent or life-threatening deterioration.  Critical care was time spent personally by me on the following activities: development of treatment plan with patient and/or surrogate as well as nursing, discussions with consultants, evaluation of patient's response to treatment, examination of patient, obtaining history from patient or surrogate, ordering and performing treatments and interventions, ordering and review of laboratory studies, ordering and review of radiographic studies, pulse oximetry and re-evaluation of patient's condition.  DIAGNOSTIC STUDIES:  COORDINATION OF CARE: 10:37 AM Discussed treatment plan with pt at bedside and pt agreed to plan.   Labs Review Labs Reviewed   BLOOD GAS, ARTERIAL - Abnormal; Notable for the following:    pH, Arterial 7.091 (*)    pCO2 arterial 102 (*)    Bicarbonate 29.5 (*)    All other components within normal limits  COMPREHENSIVE METABOLIC PANEL - Abnormal; Notable for the following:    Glucose, Bld 147 (*)    BUN 22 (*)    Creatinine, Ser 1.28 (*)    Calcium 8.5 (*)    ALT 16 (*)    GFR calc non Af Amer 57 (*)    All other components within normal limits  BRAIN NATRIURETIC PEPTIDE - Abnormal; Notable for the following:    B Natriuretic Peptide 565.0 (*)    All other components within normal limits  CBC WITH DIFFERENTIAL/PLATELET - Abnormal; Notable for the following:    Hemoglobin 12.9 (*)    RDW 16.9 (*)    All other components within normal limits  BLOOD GAS, ARTERIAL - Abnormal; Notable for the following:    pH, Arterial 7.168 (*)    pCO2 arterial  81.5 (*)    pO2, Arterial 74.6 (*)    Bicarbonate 28.4 (*)    Allens test (pass/fail) NOT INDICATED (*)    All other components within normal limits  I-STAT TROPOININ, ED  I-STAT CG4 LACTIC ACID, ED    Imaging Review Dg Chest Port 1 View  12/19/2014   CLINICAL DATA:  Shortness of breath, hypertension, hypoxemia, COPD, history CHF, smoker  EXAM: PORTABLE CHEST - 1 VIEW  COMPARISON:  Portable exam 1041 hours compared to 12/19/2012  FINDINGS: Enlargement of cardiac silhouette with pulmonary vascular congestion.  Rotated to the RIGHT.  Infiltrates in mid to lower lungs bilaterally question pneumonia versus edema.  No gross pleural effusion or pneumothorax.  IMPRESSION: Enlargement of cardiac silhouette with pulmonary vascular congestion.  Infiltrates in mid to lower lungs bilaterally which could represent pulmonary edema/failure or pneumonia.   Electronically Signed   By: Lavonia Dana M.D.   On: 12/19/2014 10:57     EKG Interpretation   Date/Time:  Sunday December 19 2014 10:34:18 EDT Ventricular Rate:  83 PR Interval:  191 QRS Duration: 100 QT Interval:  411 QTC  Calculation: 483 R Axis:   -34 Text Interpretation:  Sinus rhythm Biatrial enlargement Abnormal R-wave  progression, early transition LVH with secondary repolarization  abnormality Borderline prolonged QT interval Baseline wander in lead(s)  III V2 V3 V4 V5 Confirmed by Hazle Coca [phone]) on 12/19/2014 10:37:03 AM      MDM   Final diagnoses:  Acute respiratory failure, unspecified whether with hypoxia or hypercapnia  Hypertensive urgency    Patient here for evaluation of shortness of breath. Patient in severe distress on ED arrival. BiPAP was initiated for respiratory support. He was started on nitroglycerin drip for hypertensive urgency with pulmonary edema. On repeat evaluation following BiPAP and nitroglycerin drip patient's respiratory effort is improved with improvement in his mental status and he is now able to speak in full sentences. ABG demonstrates respiratory acidosis that is improving on the BiPAP. Discussed with hospitalist regarding admission for further management.  I personally performed the services described in this documentation, which was scribed in my presence. The recorded information has been reviewed and is accurate.    Quintella Reichert, MD 12/19/14 1447

## 2014-12-19 NOTE — ED Notes (Addendum)
hospitalist in room with pt 

## 2014-12-19 NOTE — ED Notes (Signed)
Pt 02 stat 89% on Bipap. Pt is alert and communicating with nurses at this time.

## 2014-12-19 NOTE — ED Notes (Signed)
Pt oxygen saturation decreased to 84% on Bi-PAP. Respiratory made aware and coming to increase 02 level on Bipap.   Pt is responding to nurse. Oxygen increasing to 87%.

## 2014-12-19 NOTE — H&P (Signed)
[facility] History and Physical  Bradley Bates GEX:[id number] DOB: July 14, 1945    PCP:   Bronson Curb, PA-C   Chief Complaint:  In respiratory distress.   HPI: Bradley Bates is an 69 y.o. male with hx of CHF, likely diastolic CHF, severe HTN, and non compliance with meds, COPD, active tobacco abuse, HLD, presented to the ER with acute hypercapneic respiratory failure via EMS.  Work up in the ER included ABG 7.09/102/pOx=99 on 100%.  He was placed on Bipap, and noted to have severe HTN with SBP 210, and was started on IV NTG, along with Lasix 40mg  IV given.  He improved markedly, though repeat ABG still showed hypercapneic respiratory failure, with ABG 7.17/81/Pox=74/90%.  He did not have any chest pain, coughs, fever, chills, but wife stated he has been sick for over a month with DOE.  His CXR showed vascular congestion, and EKG showed NSR with no acute ST T changes.  His initial troponin was negative.  Hospitlist was asked to admit him for respiratory failure and CHF.    Rewiew of Systems:  Constitutional: Negative for malaise, fever and chills. No significant weight loss or weight gain Eyes: Negative for eye pain, redness and discharge, diplopia, visual changes, or flashes of light. ENMT: Negative for ear pain, hoarseness, nasal congestion, sinus pressure and sore throat. No headaches; tinnitus, drooling, or problem swallowing. Cardiovascular: Negative for chest pain, palpitations, diaphoresis, dyspnea and peripheral edema. ; No orthopnea, PND Respiratory: Negative for cough, hemoptysis, wheezing and stridor. No pleuritic chestpain. Gastrointestinal: Negative for nausea, vomiting, diarrhea, constipation, abdominal pain, melena, blood in stool, hematemesis, jaundice and rectal bleeding.    Genitourinary: Negative for frequency, dysuria, incontinence,flank pain and hematuria; Musculoskeletal: Negative for back pain and neck pain. Negative for swelling and trauma.;  Skin: . Negative  for pruritus, rash, abrasions, bruising and skin lesion.; ulcerations Neuro: Negative for headache, lightheadedness and neck stiffness. Negative for weakness, altered level of consciousness , altered mental status, extremity weakness, burning feet, involuntary movement, seizure and syncope.  Psych: negative for anxiety, depression, insomnia, tearfulness, panic attacks, hallucinations, paranoia, suicidal or homicidal ideation    Past Medical History  Diagnosis Date  . Hypertension   . Bronchitis   . Hypercholesteremia   . Congestive heart failure (CHF)   . Shortness of breath   . COPD (chronic obstructive pulmonary disease)     Past Surgical History  Procedure Laterality Date  . No past surgeries    . Colonoscopy N/A 08/17/2013    Procedure: COLONOSCOPY;  Surgeon: Danie Binder, MD;  Location: [facility];  Service: Endoscopy;  Laterality: N/A;  8:30 AM    Medications:  HOME MEDS: Prior to Admission medications   Medication Sig Start Date End Date Taking? Authorizing Provider  atorvastatin (LIPITOR) 40 MG tablet Take 1 tablet (40 mg total) by mouth daily. 12/28/13  Yes Arnoldo Lenis, MD  carvedilol (COREG) 25 MG tablet Take 1 tablet (25 mg total) by mouth 2 (two) times daily. 08/21/13  Yes Arnoldo Lenis, MD  furosemide (LASIX) 40 MG tablet TAKE ONE TABLET BY MOUTH TWICE DAILY *PLEASE CALL AND MAKE APPOINTMENT WITH DR. OFFICE FOR FURTHER REFILLS: [phone]* 12/24/13  Yes Arnoldo Lenis, MD  hydrALAZINE (APRESOLINE) 25 MG tablet Take 2 tablets (50 mg total) by mouth every 8 (eight) hours. 05/01/13  Yes Lendon Colonel, NP  isosorbide mononitrate (IMDUR) 30 MG 24 hr tablet Take 1 tablet (30 mg total) by mouth daily. 10/21/13  Yes Lendon Colonel, NP  lisinopril (PRINIVIL,ZESTRIL) 40 MG tablet Take 1 tablet (40 mg total) by mouth daily. 10/13/13  Yes Arnoldo Lenis, MD  potassium chloride SA (K-DUR,KLOR-CON) 20 MEQ tablet Take 1/2 a tablet daily. Patient taking differently:  Take 20 mEq by mouth daily.  02/02/13  Yes Lendon Colonel, NP  spironolactone (ALDACTONE) 25 MG tablet TAKE ONE TABLET BY MOUTH ONCE DAILY 04/26/14  Yes Arnoldo Lenis, MD  albuterol (PROVENTIL HFA;VENTOLIN HFA) 108 (90 BASE) MCG/ACT inhaler Inhale 2 puffs into the lungs every 6 (six) hours as needed for shortness of breath.     Historical Provider, MD  albuterol (PROVENTIL) (2.5 MG/3ML) 0.083% nebulizer solution Take 2.5 mg by nebulization every 6 (six) hours as needed for wheezing.    Historical Provider, MD  nicotine (NICODERM CQ) 14 mg/24hr patch Use 1 patch daily on skin for 14 days 12/28/13   Arnoldo Lenis, MD  nicotine (NICODERM CQ) 21 mg/24hr patch Place 1 patch (21 mg total) onto the skin daily. 12/28/13   Arnoldo Lenis, MD  nicotine (NICODERM CQ) 7 mg/24hr patch Place 1 patch (7 mg total) onto the skin daily. Use for 2 weeks then stop 12/28/13   Arnoldo Lenis, MD     Allergies:  No Known Allergies  Social History:   reports that he has been smoking Cigarettes.  He has a 35 pack-year smoking history. He has never used smokeless tobacco. He reports that he does not drink alcohol or use illicit drugs.  Family History: Family History  Problem Relation Age of Onset  . Diabetes Brother   . Lung cancer Mother   . Cancer Brother   . Colon cancer Neg Hx      Physical Exam: Filed Vitals:   12/19/14 1410 12/19/14 1413 12/19/14 1415 12/19/14 1418  BP: 140/83 136/85 149/82 146/84  Pulse: 74 78 79 74  Resp: 20 18 21 21   SpO2: 92% 91% 91% 92%   Blood pressure 146/84, pulse 74, resp. rate 21, SpO2 92 %.  GEN:  Pleasant  patient lying in the stretcher in no acute distress; cooperative with exam. PSYCH:  alert and oriented x4; does not appear anxious or depressed; affect is appropriate. HEENT: Mucous membranes pink and anicteric; PERRLA; EOM intact; no cervical lymphadenopathy nor thyromegaly or carotid bruit; no JVD; There were no stridor. Neck is very supple. Breasts::  Not examined CHEST WALL: No tenderness CHEST: Normal respiration, He still has bilateral rales, with no significant wheezing.  HEART: Regular rate and rhythm.  There are no murmur, rub, or gallops.   BACK: No kyphosis or scoliosis; no CVA tenderness ABDOMEN: soft and non-tender; no masses, no organomegaly, normal abdominal bowel sounds; no pannus; no intertriginous candida. There is no rebound and no distention. Rectal Exam: Not done EXTREMITIES: No bone or joint deformity; age-appropriate arthropathy of the hands and knees; no edema; no ulcerations.  There is no calf tenderness. Genitalia: not examined PULSES: 2+ and symmetric SKIN: Normal hydration no rash or ulceration CNS: Cranial nerves 2-12 grossly intact no focal lateralizing neurologic deficit.  Speech is fluent; uvula elevated with phonation, facial symmetry and tongue midline. DTR are normal bilaterally, cerebella exam is intact, barbinski is negative and strengths are equaled bilaterally.  No sensory loss.   Labs on Admission:  Basic Metabolic Panel:  Recent Labs Lab 12/19/14 1117  NA 141  K 4.4  CL 105  CO2 27  GLUCOSE 147*  BUN 22*  CREATININE 1.28*  CALCIUM 8.5*   Liver Function Tests:  Recent Labs Lab 12/19/14 1117  AST 21  ALT 16*  ALKPHOS 65  BILITOT 0.7  PROT 7.9  ALBUMIN 4.1   CBC:  Recent Labs Lab 12/19/14 1117  WBC 9.8  NEUTROABS 6.9  HGB 12.9*  HCT 41.7  MCV 85.1  PLT 158   Radiological Exams on Admission: Dg Chest Port 1 View  12/19/2014   CLINICAL DATA:  Shortness of breath, hypertension, hypoxemia, COPD, history CHF, smoker  EXAM: PORTABLE CHEST - 1 VIEW  COMPARISON:  Portable exam 1041 hours compared to 12/19/2012  FINDINGS: Enlargement of cardiac silhouette with pulmonary vascular congestion.  Rotated to the RIGHT.  Infiltrates in mid to lower lungs bilaterally question pneumonia versus edema.  No gross pleural effusion or pneumothorax.  IMPRESSION: Enlargement of cardiac silhouette with  pulmonary vascular congestion.  Infiltrates in mid to lower lungs bilaterally which could represent pulmonary edema/failure or pneumonia.   Electronically Signed   By: Lavonia Dana M.D.   On: 12/19/2014 10:57    EKG: Independently reviewed.    Assessment/Plan Present on Admission:  . HTN (hypertension), malignant . Acute respiratory failure . Tobacco abuse  PLAN:  Will admit him to the ICU at [facility].  I think he has hypertensive pulmonary edema, and subsequently went into hypercapneic respiratory failure.  He has been given IV NTG, and lasix, and is doing much better with the Bipap.  Will continue with Bipap at this time, and will diurese him some more.  He will need to be compliant with his meds, and I have resume his Coreg, Hydralazine, ACE I, but will hold Imdur and Aldactone while given IV forms.  Will obtain ECHO, and r/out with serial troponins.  He must stop cigarettes.  He is clinically better than his ABG, and will continue with current regimen.  Tomorrow, will taper his IV NTG, resume his oral meds, and I think he can be transition off Bipap rather quickly.   He is a full code, and will be admitted to [facility].  Thanks and Good day.   Other plans as per orders.  Code Status:  FULL Haskel Khan, MD. [facility] Pager [phone] 7pm to 7am.  12/19/2014, 2:36 PM

## 2014-12-19 NOTE — ED Notes (Signed)
Nurse at bedside monitoring pt.

## 2014-12-19 NOTE — ED Notes (Signed)
Pt comes in from home for SOB starting this morning after a cigarette. Pt has history of COPD. Pt given albuterol, duoneb, and 125 solumedrol in route. MD at bedside. Pt currently getting duoneb.

## 2014-12-19 NOTE — ED Notes (Signed)
NAD noted. Pt oxygen saturation 90% on bipap. No distress noted.

## 2014-12-19 NOTE — ED Notes (Signed)
CRITICAL VALUE ALERT  Critical value received: PH - 7.168 CO2 - 81.5 PO2 - 74.6 HCO3 - 28.4  Date of notification:  12/19/2014  Time of notification:  12:31  Critical value read back:Yes.    Nurse who received alert:  Charmayne Sheer & Harlan Stains  MD notified (1st page):  Ralene Bathe  Time of first page:  12:33

## 2014-12-20 ENCOUNTER — Inpatient Hospital Stay (INDEPENDENT_AMBULATORY_CARE_PROVIDER_SITE_OTHER): Payer: Medicare HMO

## 2014-12-20 DIAGNOSIS — I509 Heart failure, unspecified: Secondary | ICD-10-CM

## 2014-12-20 DIAGNOSIS — J9600 Acute respiratory failure, unspecified whether with hypoxia or hypercapnia: Secondary | ICD-10-CM

## 2014-12-20 DIAGNOSIS — I110 Hypertensive heart disease with heart failure: Secondary | ICD-10-CM | POA: Diagnosis not present

## 2014-12-20 LAB — TROPONIN I: Troponin I: 0.04 ng/mL — ABNORMAL HIGH (ref <0.031)

## 2014-12-20 LAB — COMPREHENSIVE METABOLIC PANEL
ALK PHOS: 56 U/L (ref 38–126)
ALT: 14 U/L — ABNORMAL LOW (ref 17–63)
AST: 14 U/L — AB (ref 15–41)
Albumin: 3.5 g/dL (ref 3.5–5.0)
Anion gap: 6 (ref 5–15)
BILIRUBIN TOTAL: 0.4 mg/dL (ref 0.3–1.2)
BUN: 32 mg/dL — AB (ref 6–20)
CALCIUM: 8.2 mg/dL — AB (ref 8.9–10.3)
CO2: 32 mmol/L (ref 22–32)
Chloride: 103 mmol/L (ref 101–111)
Creatinine, Ser: 1.47 mg/dL — ABNORMAL HIGH (ref 0.61–1.24)
GFR calc Af Amer: 56 mL/min — ABNORMAL LOW (ref >60)
GFR calc non Af Amer: 48 mL/min — ABNORMAL LOW (ref >60)
Glucose, Bld: 112 mg/dL — ABNORMAL HIGH (ref 65–99)
POTASSIUM: 4.8 mmol/L (ref 3.5–5.1)
Sodium: 141 mmol/L (ref 135–145)
TOTAL PROTEIN: 7 g/dL (ref 6.5–8.1)

## 2014-12-20 LAB — CBC
HEMATOCRIT: 40.8 % (ref 39.0–52.0)
Hemoglobin: 12.2 g/dL — ABNORMAL LOW (ref 13.0–17.0)
MCH: 25.5 pg — ABNORMAL LOW (ref 26.0–34.0)
MCHC: 29.9 g/dL — AB (ref 30.0–36.0)
MCV: 85.2 fL (ref 78.0–100.0)
Platelets: 155 10*3/uL (ref 150–400)
RBC: 4.79 MIL/uL (ref 4.22–5.81)
RDW: 17 % — ABNORMAL HIGH (ref 11.5–15.5)
WBC: 11.4 10*3/uL — ABNORMAL HIGH (ref 4.0–10.5)

## 2014-12-20 MED ORDER — HYDRALAZINE HCL 25 MG PO TABS
25.0000 mg | ORAL_TABLET | Freq: Once | ORAL | Status: AC
  Administered 2014-12-20: 25 mg via ORAL

## 2014-12-20 MED ORDER — IPRATROPIUM-ALBUTEROL 0.5-2.5 (3) MG/3ML IN SOLN
3.0000 mL | Freq: Four times a day (QID) | RESPIRATORY_TRACT | Status: DC
  Administered 2014-12-20 – 2014-12-21 (×6): 3 mL via RESPIRATORY_TRACT
  Filled 2014-12-20 (×6): qty 3

## 2014-12-20 NOTE — Progress Notes (Signed)
Patient stated he does not wear a BIPAP or CPAP at home and doesn't plan to wear one tonight unless he needs it. Hospital unit at bedside.

## 2014-12-20 NOTE — Progress Notes (Signed)
[facility] PROGRESS NOTE  Bradley Bates MPN:[id number] DOB: 02-04-1946    PCP:   Bronson Curb, PA-C   HPI: Bradley Bates is an 69 y.o. male with hx of HTN, HLD, CHF, COPD on home oxygen, admitted for hypertensive pulmonary edema.  He was given Bipap, IV NTG, and resume his anti HTN.  He had been very non compliant, and hadn't taken his BP meds.  In the ER, he was in pulm edema, with SBP 210.  He is feeling better today and wanting to go home.   Rewiew of Systems:  Constitutional: Negative for malaise, fever and chills. No significant weight loss or weight gain Eyes: Negative for eye pain, redness and discharge, diplopia, visual changes, or flashes of light. ENMT: Negative for ear pain, hoarseness, nasal congestion, sinus pressure and sore throat. No headaches; tinnitus, drooling, or problem swallowing. Cardiovascular: Negative for chest pain, palpitations, diaphoresis, dyspnea and peripheral edema. ; No orthopnea, PND Respiratory: Negative for cough, hemoptysis, wheezing and stridor. No pleuritic chestpain. Gastrointestinal: Negative for nausea, vomiting, diarrhea, constipation, abdominal pain, melena, blood in stool, hematemesis, jaundice and rectal bleeding.    Genitourinary: Negative for frequency, dysuria, incontinence,flank pain and hematuria; Musculoskeletal: Negative for back pain and neck pain. Negative for swelling and trauma.;  Skin: . Negative for pruritus, rash, abrasions, bruising and skin lesion.; ulcerations Neuro: Negative for headache, lightheadedness and neck stiffness. Negative for weakness, altered level of consciousness , altered mental status, extremity weakness, burning feet, involuntary movement, seizure and syncope.  Psych: negative for anxiety, depression, insomnia, tearfulness, panic attacks, hallucinations, paranoia, suicidal or homicidal ideation    Past Medical History  Diagnosis Date  . Hypertension   . Bronchitis   . Hypercholesteremia   .  Congestive heart failure (CHF)   . Shortness of breath   . COPD (chronic obstructive pulmonary disease)     Past Surgical History  Procedure Laterality Date  . No past surgeries    . Colonoscopy N/A 08/17/2013    Procedure: COLONOSCOPY;  Surgeon: Danie Binder, MD;  Location: [facility];  Service: Endoscopy;  Laterality: N/A;  8:30 AM    Medications:  HOME MEDS: Prior to Admission medications   Medication Sig Start Date End Date Taking? Authorizing Provider  albuterol (PROVENTIL) (2.5 MG/3ML) 0.083% nebulizer solution Take 2.5 mg by nebulization every 6 (six) hours as needed for wheezing.   Yes Historical Provider, MD  atorvastatin (LIPITOR) 40 MG tablet Take 1 tablet (40 mg total) by mouth daily. 12/28/13  Yes Arnoldo Lenis, MD  carvedilol (COREG) 25 MG tablet Take 1 tablet (25 mg total) by mouth 2 (two) times daily. 08/21/13  Yes Arnoldo Lenis, MD  furosemide (LASIX) 40 MG tablet TAKE ONE TABLET BY MOUTH TWICE DAILY *PLEASE CALL AND MAKE APPOINTMENT WITH DR. OFFICE FOR FURTHER REFILLS: [phone]* 12/24/13  Yes Arnoldo Lenis, MD  hydrALAZINE (APRESOLINE) 25 MG tablet Take 2 tablets (50 mg total) by mouth every 8 (eight) hours. 05/01/13  Yes Lendon Colonel, NP  isosorbide mononitrate (IMDUR) 30 MG 24 hr tablet Take 1 tablet (30 mg total) by mouth daily. 10/21/13  Yes Lendon Colonel, NP  lisinopril (PRINIVIL,ZESTRIL) 40 MG tablet Take 1 tablet (40 mg total) by mouth daily. 10/13/13  Yes Arnoldo Lenis, MD  potassium chloride SA (K-DUR,KLOR-CON) 20 MEQ tablet Take 1/2 a tablet daily. Patient taking differently: Take 20 mEq by mouth daily.  02/02/13  Yes Lendon Colonel, NP  spironolactone (ALDACTONE) 25  MG tablet TAKE ONE TABLET BY MOUTH ONCE DAILY 04/26/14  Yes Arnoldo Lenis, MD  albuterol (PROVENTIL HFA;VENTOLIN HFA) 108 (90 BASE) MCG/ACT inhaler Inhale 2 puffs into the lungs every 6 (six) hours as needed for shortness of breath.     Historical Provider, MD      Allergies:  No Known Allergies  Social History:   reports that he has been smoking Cigarettes.  He has a 35 pack-year smoking history. He has never used smokeless tobacco. He reports that he does not drink alcohol or use illicit drugs.  Family History: Family History  Problem Relation Age of Onset  . Diabetes Brother   . Lung cancer Mother   . Cancer Brother   . Colon cancer Neg Hx      Physical Exam: Filed Vitals:   12/20/14 0509 12/20/14 0700 12/20/14 0739 12/20/14 0740  BP:  125/57    Pulse:  71    Temp:   97.6 F (36.4 C)   TempSrc:   Oral   Resp:      Height:      Weight:      SpO2: 93% 94%  95%   Blood pressure 125/57, pulse 71, temperature 97.6 F (36.4 C), temperature source Oral, resp. rate 22, height 5\' 9"  (1.753 m), weight 105.2 kg (231 lb 14.8 oz), SpO2 95 %.  GEN:  Pleasant  patient lying in the stretcher in no acute distress; cooperative with exam. PSYCH:  alert and oriented x4; does not appear anxious or depressed; affect is appropriate. HEENT: Mucous membranes pink and anicteric; PERRLA; EOM intact; no cervical lymphadenopathy nor thyromegaly or carotid bruit; no JVD; There were no stridor. Neck is very supple. Breasts:: Not examined CHEST WALL: No tenderness CHEST: Normal respiration, clear to auscultation bilaterally.  HEART: Regular rate and rhythm.  There are no murmur, rub, or gallops.   BACK: No kyphosis or scoliosis; no CVA tenderness ABDOMEN: soft and non-tender; no masses, no organomegaly, normal abdominal bowel sounds; no pannus; no intertriginous candida. There is no rebound and no distention. Rectal Exam: Not done EXTREMITIES: No bone or joint deformity; age-appropriate arthropathy of the hands and knees; no edema; no ulcerations.  There is no calf tenderness. Genitalia: not examined PULSES: 2+ and symmetric SKIN: Normal hydration no rash or ulceration CNS: Cranial nerves 2-12 grossly intact no focal lateralizing neurologic deficit.   Speech is fluent; uvula elevated with phonation, facial symmetry and tongue midline. DTR are normal bilaterally, cerebella exam is intact, barbinski is negative and strengths are equaled bilaterally.  No sensory loss.   Labs on Admission:  Basic Metabolic Panel:  Recent Labs Lab 12/19/14 1117 12/20/14 0353  NA 141 141  K 4.4 4.8  CL 105 103  CO2 27 32  GLUCOSE 147* 112*  BUN 22* 32*  CREATININE 1.28* 1.47*  CALCIUM 8.5* 8.2*   Liver Function Tests:  Recent Labs Lab 12/19/14 1117 12/20/14 0353  AST 21 14*  ALT 16* 14*  ALKPHOS 65 56  BILITOT 0.7 0.4  PROT 7.9 7.0  ALBUMIN 4.1 3.5   CBC:  Recent Labs Lab 12/19/14 1117 12/20/14 0353  WBC 9.8 11.4*  NEUTROABS 6.9  --   HGB 12.9* 12.2*  HCT 41.7 40.8  MCV 85.1 85.2  PLT 158 155   Cardiac Enzymes:  Recent Labs Lab 12/19/14 1639 12/19/14 2124 12/20/14 0353  TROPONINI 0.06* 0.04* 0.04*    CBG: No results for input(s): GLUCAP in the last 168 hours.   Radiological Exams  on Admission: Dg Chest Port 1 View  12/19/2014   CLINICAL DATA:  Shortness of breath, hypertension, hypoxemia, COPD, history CHF, smoker  EXAM: PORTABLE CHEST - 1 VIEW  COMPARISON:  Portable exam 1041 hours compared to 12/19/2012  FINDINGS: Enlargement of cardiac silhouette with pulmonary vascular congestion.  Rotated to the RIGHT.  Infiltrates in mid to lower lungs bilaterally question pneumonia versus edema.  No gross pleural effusion or pneumothorax.  IMPRESSION: Enlargement of cardiac silhouette with pulmonary vascular congestion.  Infiltrates in mid to lower lungs bilaterally which could represent pulmonary edema/failure or pneumonia.   Electronically Signed   By: Lavonia Dana M.D.   On: 12/19/2014 10:57    EKG: Independently reviewed.    Assessment/Plan Present on Admission:  . HTN (hypertension), malignant . Acute respiratory failure . Tobacco abuse  PLAN:  HTN Pulmonary edema:  Will transfer him to the floor after IV NTG wean.   Continue with his anti HTN at this time, as his BP is good.    Elevated troponins:  Very mildly, from CHF likely.  Acute resp failure:  He has home oxygen.  Tobacco use:  He will need to quit.   Other plans as per orders.  Code Status:  FULL Haskel Khan, MD. [facility] Pager [phone] 7pm to 7am.  12/20/2014, 8:50 AM

## 2014-12-20 NOTE — Progress Notes (Addendum)
pts BP113/43 HR 60. Mid-level provider notified and 2200 dose of hydralazine changed from 50mg  to 25mg .

## 2014-12-20 NOTE — Progress Notes (Signed)
Patient stayed on BiPAP most of night ,till about 04:30 , Its doubtful how much it help him as he never appeared to cycle with him. He has extremely low short inspirations VT 200 cc or less. He is awake this am on 3 lpm / [place]. Saturation 92. Suspect his PCO2 is chronically very high 70-80 or better and PH very acidotic most of time.

## 2014-12-20 NOTE — Progress Notes (Signed)
Transfer orders received; report given to Nitro, RN on 300; pt taken with belongings via [place] by RN and NT in stable condition.

## 2014-12-21 LAB — BASIC METABOLIC PANEL
Anion gap: 6 (ref 5–15)
BUN: 36 mg/dL — ABNORMAL HIGH (ref 6–20)
CALCIUM: 8.4 mg/dL — AB (ref 8.9–10.3)
CO2: 37 mmol/L — ABNORMAL HIGH (ref 22–32)
Chloride: 101 mmol/L (ref 101–111)
Creatinine, Ser: 1.27 mg/dL — ABNORMAL HIGH (ref 0.61–1.24)
GFR calc Af Amer: >60 mL/min (ref >60)
GFR calc non Af Amer: 57 mL/min — ABNORMAL LOW (ref >60)
GLUCOSE: 90 mg/dL (ref 65–99)
Potassium: 4.7 mmol/L (ref 3.5–5.1)
SODIUM: 144 mmol/L (ref 135–145)

## 2014-12-21 MED ORDER — NICOTINE 14 MG/24HR TD PT24: 14.0000 mg | MEDICATED_PATCH | Freq: Every day | TRANSDERMAL | Status: DC

## 2014-12-21 MED ORDER — ASPIRIN 81 MG PO TBEC: 81.0000 mg | DELAYED_RELEASE_TABLET | Freq: Every day | ORAL | Status: AC

## 2014-12-21 NOTE — Progress Notes (Signed)
Patient BP 108/56 despite hydralazine being held this morning. Dr. Marin Comment made aware will hold this dose and he will stop this medication.

## 2014-12-21 NOTE — Care Management Note (Signed)
Case Management Note  Patient Details  Name: Bradley Bates MRN: [id number] Date of Birth: 19-Oct-1945  Subjective/Objective:                    Action/Plan:   Expected Discharge Date:                  Expected Discharge Plan:  Home/Self Care  In-House Referral:  NA  Discharge planning Services  CM Consult  Post Acute Care Choice:  NA Choice offered to:  NA  DME Arranged:    DME Agency:     HH Arranged:    [place] Agency:     Status of Service:  Completed, signed off  Medicare Important Message Given:    Date Medicare IM Given:    Medicare IM give by:    Date Additional Medicare IM Given:    Additional Medicare Important Message give by:     If discussed at [place] of Stay Meetings, dates discussed:    Additional Comments: Pt to be discharged home today. Pt has home O2 in place with [facility] and neb machine. Pt has portable tanks and concentrator. Pt wife to obtain portable tank from home so that pt can be discharged. Pt and pts nurse aware of discharge arrangements. Christinia Gully [place], RN 12/21/2014, 4:09 PM

## 2014-12-21 NOTE — Care Management Note (Signed)
Case Management Note  Patient Details  Name: ROMIN DIVITA MRN: [id number] Date of Birth: [birth year]  Subjective/Objective:                  Pt admitted from home with HTN urgency. Pt lives with his wife and will return home at discharge. Pt is independent with ADL's.  Action/Plan: Will continue to follow for discharge planning needs.  Expected Discharge Date:                  Expected Discharge Plan:  Home/Self Care  In-House Referral:  NA  Discharge planning Services  CM Consult  Post Acute Care Choice:  NA Choice offered to:  NA  DME Arranged:    DME Agency:     HH Arranged:    HH Agency:     Status of Service:  Completed, signed off  Medicare Important Message Given:    Date Medicare IM Given:    Medicare IM give by:    Date Additional Medicare IM Given:    Additional Medicare Important Message give by:     If discussed at [place] of Stay Meetings, dates discussed:    Additional Comments:  Joylene Draft, RN 12/21/2014, 2:49 PM

## 2014-12-21 NOTE — Discharge Summary (Signed)
Physician Discharge Summary  Bradley Bates KZS:[id number] DOB: July 07, 1945 DOA: 12/19/2014  PCP: Bronson Curb, PA-C  Admit date: 12/19/2014 Discharge date: 12/21/2014  Time spent: 35 minutes  Recommendations for Outpatient Follow-up:  1. Follow up with your PCP in one week.   Discharge Diagnoses:  Principal Problem:   HTN (hypertension), malignant Active Problems:   Acute respiratory failure   Acute decompensated heart failure   Tobacco abuse   Medically noncompliant   Hypertensive CHF (congestive heart failure)   Discharge Condition: marked improvement.  No CP, DOE, BP is controlled.   Diet recommendation: Cardiac diet.   Filed Weights   12/19/14 1536 12/20/14 0500 12/21/14 0615  Weight: 105.2 kg (231 lb 14.8 oz) 105.2 kg (231 lb 14.8 oz) 103.7 kg (228 lb 9.9 oz)    History of present illness:  Patient was admitted by me for hypertensive pulmonary edema due to non compliance of meds on August 7th, 2016.  As per my previous H and P:  " Bradley Bates is an 69 y.o. male with hx of CHF, likely diastolic CHF, severe HTN, and non compliance with meds, COPD, active tobacco abuse, HLD, presented to the ER with acute hypercapneic respiratory failure via EMS. Work up in the ER included ABG 7.09/102/pOx=99 on 100%. He was placed on Bipap, and noted to have severe HTN with SBP 210, and was started on IV NTG, along with Lasix 40mg  IV given. He improved markedly, though repeat ABG still showed hypercapneic respiratory failure, with ABG 7.17/81/Pox=74/90%. He did not have any chest pain, coughs, fever, chills, but wife stated he has been sick for over a month with DOE. His CXR showed vascular congestion, and EKG showed NSR with no acute ST T changes. His initial troponin was negative. [facility] was asked to admit him for respiratory failure and CHF.    Hospital Course: Patient was admitted into the ICU.  His troponins were only slighltly elevated to 0.06 and never higher.  It was felt  that it was due to CHF.  He did not have any acute EKG changes, and had no chest pain.  It was most likely a hypertensive pulmonary edema rather than ischemic CHF.  He was placed on IV NTG in the ER, and his home meds including Coreg, Lisinopril, Hydralazine and diuretics were resumed.  His BP quickly became controlled, and his lung exams became clear.  He was given IV Lasix.   His ECHO showed EF of 60 to 65%, with grade 1 diastolic dysfx.  His aortic root was mildly dilated, and his LA was moderate to severely dilated with pulmonary pressure of 57mmHg.  No valvular dysfx, but LV showed severe LVH.  He felt markedly better with his BP under better controlled.  His BP was actually soft with SBP of 108, so hydralazine was not given to him.  He will need to follow up with his PCP in one week.  He was told that his BP meds have to be taken, and that he is not to go back to smoking.  He understood recommendation, anxious to go home, and is stable for discharge.  Thank you for allowing me to participate in his care.    Consultations:  None.   Discharge Exam: Filed Vitals:   12/21/14 1546  BP: 108/56  Pulse: 62  Temp: 97.6 F (36.4 C)  Resp:    Discharge Instructions   Discharge Instructions    Diet - low sodium heart healthy    Complete by:  As directed      Diet - low sodium heart healthy    Complete by:  As directed      Discharge instructions    Complete by:  As directed   Take your medicine as directed.  Do not smoke and follow up with your primary care physician as directed.     Increase activity slowly    Complete by:  As directed      Increase activity slowly    Complete by:  As directed           Current Discharge Medication List    START taking these medications   Details  aspirin EC 81 MG EC tablet Take 1 tablet (81 mg total) by mouth daily. Qty: 100 tablet, Refills: 1      CONTINUE these medications which have CHANGED   Details  nicotine (NICODERM CQ) 14 mg/24hr patch  Place 1 patch (14 mg total) onto the skin daily. Qty: 28 patch, Refills: 0      CONTINUE these medications which have NOT CHANGED   Details  atorvastatin (LIPITOR) 40 MG tablet Take 1 tablet (40 mg total) by mouth daily. Qty: 90 tablet, Refills: 3    carvedilol (COREG) 25 MG tablet Take 1 tablet (25 mg total) by mouth 2 (two) times daily. Qty: 180 tablet, Refills: 3    furosemide (LASIX) 40 MG tablet TAKE ONE TABLET BY MOUTH TWICE DAILY *PLEASE CALL AND MAKE APPOINTMENT WITH DR. OFFICE FOR FURTHER REFILLS: [phone]* Qty: 60 tablet, Refills: 3    isosorbide mononitrate (IMDUR) 30 MG 24 hr tablet Take 1 tablet (30 mg total) by mouth daily. Qty: 30 tablet, Refills: 6    lisinopril (PRINIVIL,ZESTRIL) 40 MG tablet Take 1 tablet (40 mg total) by mouth daily. Qty: 90 tablet, Refills: 3   Associated Diagnoses: Congestive heart failure, unspecified    potassium chloride SA (K-DUR,KLOR-CON) 20 MEQ tablet Take 1/2 a tablet daily. Qty: 30 tablet, Refills: 6    albuterol (PROVENTIL HFA;VENTOLIN HFA) 108 (90 BASE) MCG/ACT inhaler Inhale 2 puffs into the lungs every 6 (six) hours as needed for shortness of breath.       STOP taking these medications     albuterol (PROVENTIL) (2.5 MG/3ML) 0.083% nebulizer solution      hydrALAZINE (APRESOLINE) 25 MG tablet      spironolactone (ALDACTONE) 25 MG tablet        No Known Allergies    The results of significant diagnostics from this hospitalization (including imaging, microbiology, ancillary and laboratory) are listed below for reference.    Significant Diagnostic Studies: Dg Chest Port 1 View  12/19/2014   CLINICAL DATA:  Shortness of breath, hypertension, hypoxemia, COPD, history CHF, smoker  EXAM: PORTABLE CHEST - 1 VIEW  COMPARISON:  Portable exam 1041 hours compared to 12/19/2012  FINDINGS: Enlargement of cardiac silhouette with pulmonary vascular congestion.  Rotated to the RIGHT.  Infiltrates in mid to lower lungs bilaterally question  pneumonia versus edema.  No gross pleural effusion or pneumothorax.  IMPRESSION: Enlargement of cardiac silhouette with pulmonary vascular congestion.  Infiltrates in mid to lower lungs bilaterally which could represent pulmonary edema/failure or pneumonia.   Electronically Signed   By: Lavonia Dana M.D.   On: 12/19/2014 10:57    Microbiology: Recent Results (from the past 240 hour(s))  MRSA PCR Screening     Status: None   Collection Time: 12/19/14  3:21 PM  Result Value Ref Range Status   MRSA by PCR NEGATIVE NEGATIVE  Final    Comment:        The GeneXpert MRSA Assay (FDA approved for NASAL specimens only), is one component of a comprehensive MRSA colonization surveillance program. It is not intended to diagnose MRSA infection nor to guide or monitor treatment for MRSA infections.      Labs: Basic Metabolic Panel:  Recent Labs Lab 12/19/14 1117 12/20/14 0353 12/21/14 0637  NA 141 141 144  K 4.4 4.8 4.7  CL 105 103 101  CO2 27 32 37*  GLUCOSE 147* 112* 90  BUN 22* 32* 36*  CREATININE 1.28* 1.47* 1.27*  CALCIUM 8.5* 8.2* 8.4*   Liver Function Tests:  Recent Labs Lab 12/19/14 1117 12/20/14 0353  AST 21 14*  ALT 16* 14*  ALKPHOS 65 56  BILITOT 0.7 0.4  PROT 7.9 7.0  ALBUMIN 4.1 3.5   CBC:  Recent Labs Lab 12/19/14 1117 12/20/14 0353  WBC 9.8 11.4*  NEUTROABS 6.9  --   HGB 12.9* 12.2*  HCT 41.7 40.8  MCV 85.1 85.2  PLT 158 155   Cardiac Enzymes:  Recent Labs Lab 12/19/14 1639 12/19/14 2124 12/20/14 0353  TROPONINI 0.06* 0.04* 0.04*   BNP: BNP (last 3 results)  Recent Labs  12/19/14 1117  BNP 565.0*    Signed:  Tamora Huneke  [facility] 12/21/2014, 4:05 PM

## 2014-12-21 NOTE — Progress Notes (Signed)
Iv removed. Discharge instructions reviewed with patient and wife. Understanding verbalized. Patient and wife educated on nicotine patch and not smoking while using patch. Patient ready for discharge home.

## 2014-12-21 NOTE — Care Management Important Message (Signed)
Important Message  Patient Details  Name: HERCULES HASLER MRN: [id number] Date of Birth: 1946-03-03   Medicare Important Message Given:  N/A - LOS <3 / Initial given by admissions    Joylene Draft, RN 12/21/2014, 4:11 PM

## 2015-09-05 ENCOUNTER — Encounter (HOSPITAL_COMMUNITY): Payer: Self-pay | Admitting: *Deleted

## 2015-09-05 ENCOUNTER — Emergency Department (HOSPITAL_COMMUNITY): Payer: Medicare HMO

## 2015-09-05 ENCOUNTER — Emergency Department (HOSPITAL_COMMUNITY)
Admission: EM | Admit: 2015-09-05 | Discharge: 2015-09-06 | Disposition: A | Discharge status: Other Acute Inpatient Hospital | Payer: Medicare HMO | Attending: Emergency Medicine | Admitting: Emergency Medicine

## 2015-09-05 DIAGNOSIS — J441 Chronic obstructive pulmonary disease with (acute) exacerbation: Secondary | ICD-10-CM | POA: Insufficient documentation

## 2015-09-05 DIAGNOSIS — I110 Hypertensive heart disease with heart failure: Secondary | ICD-10-CM | POA: Diagnosis not present

## 2015-09-05 DIAGNOSIS — Z7982 Long term (current) use of aspirin: Secondary | ICD-10-CM | POA: Insufficient documentation

## 2015-09-05 DIAGNOSIS — F17210 Nicotine dependence, cigarettes, uncomplicated: Secondary | ICD-10-CM | POA: Insufficient documentation

## 2015-09-05 DIAGNOSIS — R0602 Shortness of breath: Secondary | ICD-10-CM | POA: Diagnosis present

## 2015-09-05 DIAGNOSIS — I509 Heart failure, unspecified: Secondary | ICD-10-CM | POA: Insufficient documentation

## 2015-09-05 DIAGNOSIS — Z79899 Other long term (current) drug therapy: Secondary | ICD-10-CM | POA: Diagnosis not present

## 2015-09-05 DIAGNOSIS — J9600 Acute respiratory failure, unspecified whether with hypoxia or hypercapnia: Secondary | ICD-10-CM | POA: Diagnosis not present

## 2015-09-05 LAB — CBC WITH DIFFERENTIAL/PLATELET
Basophils Absolute: 0 10*3/uL (ref 0.0–0.1)
Basophils Relative: 0 %
EOS PCT: 1 %
Eosinophils Absolute: 0.1 10*3/uL (ref 0.0–0.7)
HEMATOCRIT: 47.5 % (ref 39.0–52.0)
HEMOGLOBIN: 14.4 g/dL (ref 13.0–17.0)
LYMPHS ABS: 1.4 10*3/uL (ref 0.7–4.0)
LYMPHS PCT: 24 %
MCH: 24.8 pg — AB (ref 26.0–34.0)
MCHC: 30.3 g/dL (ref 30.0–36.0)
MCV: 81.9 fL (ref 78.0–100.0)
MONO ABS: 0.8 10*3/uL (ref 0.1–1.0)
MONOS PCT: 14 %
Neutro Abs: 3.5 10*3/uL (ref 1.7–7.7)
Neutrophils Relative %: 61 %
Platelets: 153 10*3/uL (ref 150–400)
RBC: 5.8 MIL/uL (ref 4.22–5.81)
RDW: 16.7 % — ABNORMAL HIGH (ref 11.5–15.5)
Smear Review: ADEQUATE
WBC: 5.7 10*3/uL (ref 4.0–10.5)

## 2015-09-05 LAB — COMPREHENSIVE METABOLIC PANEL
ALBUMIN: 3.5 g/dL (ref 3.5–5.0)
ALK PHOS: 61 U/L (ref 38–126)
ALT: 29 U/L (ref 17–63)
AST: 31 U/L (ref 15–41)
Anion gap: 9 (ref 5–15)
BILIRUBIN TOTAL: 0.9 mg/dL (ref 0.3–1.2)
BUN: 27 mg/dL — AB (ref 6–20)
CALCIUM: 8.3 mg/dL — AB (ref 8.9–10.3)
CO2: 32 mmol/L (ref 22–32)
CREATININE: 1.61 mg/dL — AB (ref 0.61–1.24)
Chloride: 102 mmol/L (ref 101–111)
GFR calc Af Amer: 49 mL/min — ABNORMAL LOW (ref >60)
GFR calc non Af Amer: 43 mL/min — ABNORMAL LOW (ref >60)
GLUCOSE: 121 mg/dL — AB (ref 65–99)
Potassium: 5.4 mmol/L — ABNORMAL HIGH (ref 3.5–5.1)
SODIUM: 143 mmol/L (ref 135–145)
Total Protein: 6.9 g/dL (ref 6.5–8.1)

## 2015-09-05 LAB — BRAIN NATRIURETIC PEPTIDE: B Natriuretic Peptide: 731 pg/mL — ABNORMAL HIGH (ref 0.0–100.0)

## 2015-09-05 LAB — TROPONIN I: Troponin I: 0.15 ng/mL — ABNORMAL HIGH (ref <0.031)

## 2015-09-05 MED ORDER — ALBUTEROL (5 MG/ML) CONTINUOUS INHALATION SOLN: 10.0000 mg/h | INHALATION_SOLUTION | RESPIRATORY_TRACT | Status: DC

## 2015-09-05 MED ORDER — ALBUTEROL (5 MG/ML) CONTINUOUS INHALATION SOLN
10.0000 mg/h | INHALATION_SOLUTION | Freq: Once | RESPIRATORY_TRACT | Status: AC
  Administered 2015-09-06: 10 mg/h via RESPIRATORY_TRACT

## 2015-09-05 MED ORDER — ALBUTEROL (5 MG/ML) CONTINUOUS INHALATION SOLN
INHALATION_SOLUTION | RESPIRATORY_TRACT | Status: AC
  Administered 2015-09-05: 21:00:00
  Filled 2015-09-05: qty 20

## 2015-09-05 MED ORDER — IPRATROPIUM BROMIDE 0.02 % IN SOLN
RESPIRATORY_TRACT | Status: AC
  Administered 2015-09-05: 21:00:00
  Filled 2015-09-05: qty 2.5

## 2015-09-05 MED ORDER — FUROSEMIDE 10 MG/ML IJ SOLN
40.0000 mg | Freq: Once | INTRAMUSCULAR | Status: AC
  Administered 2015-09-06: 40 mg via INTRAVENOUS
  Filled 2015-09-05: qty 4

## 2015-09-05 MED ORDER — ROCURONIUM BROMIDE 50 MG/5ML IV SOLN
INTRAVENOUS | Status: AC | PRN
  Administered 2015-09-05: 100 mg via INTRAVENOUS

## 2015-09-05 MED ORDER — NALOXONE HCL 0.4 MG/ML IJ SOLN
0.4000 mg | Freq: Once | INTRAMUSCULAR | Status: AC
  Administered 2015-09-05: 0.4 mg via INTRAVENOUS
  Filled 2015-09-05: qty 1

## 2015-09-05 MED ORDER — ETOMIDATE 2 MG/ML IV SOLN
INTRAVENOUS | Status: AC | PRN
  Administered 2015-09-05: 30 mg via INTRAVENOUS

## 2015-09-05 NOTE — ED Provider Notes (Signed)
CSN: [id number]     Arrival date & time 09/05/15  2044 History  By signing my name below, I, Bradley Bates, attest that this documentation has been prepared under the direction and in the presence of [name], MD. Electronically Signed: Soijett Bates, ED Scribe. 09/05/2015. 9:13 PM.   Chief Complaint  Patient presents with  . Shortness of Breath    Level V caveat due to respiratory status.  The history is provided by the patient. No language interpreter was used.    Bradley Bates is a 70 y.o. male with a medical hx of CHF, bronchitis, COPD, SOB who presents to the Emergency Department via EMS complaining of SOB onset PTA. Pt states that his SOB is worsened with the smell of chemicals. Pt is supposed to be on oxygen at home, but he will not use his oxygen. Pt is having associated symptoms of mild CP, cough, bilateral LE swelling. He notes that he has not tried any medications for the relief of his symptoms. He denies fever and any other symptoms.    Past Medical History  Diagnosis Date  . Hypertension   . Bronchitis   . Hypercholesteremia   . Congestive heart failure (CHF) ([place])   . Shortness of breath   . COPD (chronic obstructive pulmonary disease) [facility])    Past Surgical History  Procedure Laterality Date  . No past surgeries    . Colonoscopy N/A 08/17/2013    Procedure: COLONOSCOPY;  Surgeon: Danie Binder, MD;  [facility];  Service: Endoscopy;  Laterality: N/A;  8:30 AM   Family History  Problem Relation Age of Onset  . Diabetes Brother   . Lung cancer Mother   . Cancer Brother   . Colon cancer Neg Hx    Social History  Substance Use Topics  . Smoking status: Current Every Day Smoker -- 1.00 packs/day for 35 years    Types: Cigarettes  . Smokeless tobacco: Never Used  . Alcohol Use: No    Review of Systems  Constitutional: Negative for fever.  Respiratory: Positive for cough and shortness of breath.   Cardiovascular: Positive for chest pain and leg  swelling.  Gastrointestinal: Negative for abdominal pain.  Genitourinary: Negative for hematuria.  Musculoskeletal: Negative for back pain.      Allergies  Review of patient's allergies indicates no known allergies.  Home Medications   Prior to Admission medications   Medication Sig Start Date End Date Taking? Authorizing Provider  albuterol (PROVENTIL HFA;VENTOLIN HFA) 108 (90 BASE) MCG/ACT inhaler Inhale 2 puffs into the lungs every 6 (six) hours as needed for shortness of breath.    Yes Historical Provider, MD  aspirin EC 81 MG EC tablet Take 1 tablet (81 mg total) by mouth daily. 12/21/14  Yes Orvan Falconer, MD  atorvastatin (LIPITOR) 40 MG tablet Take 1 tablet (40 mg total) by mouth daily. Patient taking differently: Take 80 mg by mouth daily.  12/28/13  Yes Arnoldo Lenis, MD  furosemide (LASIX) 40 MG tablet TAKE ONE TABLET BY MOUTH TWICE DAILY *PLEASE CALL AND MAKE APPOINTMENT WITH DR. OFFICE FOR FURTHER REFILLS: [id number]* 12/24/13  Yes Arnoldo Lenis, MD  ipratropium-albuterol (DUONEB) 0.5-2.5 (3) MG/3ML SOLN Take 3 mLs by nebulization 3 (three) times daily.   Yes Historical Provider, MD  isosorbide mononitrate (IMDUR) 30 MG 24 hr tablet Take 1 tablet (30 mg total) by mouth daily. 10/21/13  Yes Lendon Colonel, NP  lisinopril (PRINIVIL,ZESTRIL) 40 MG tablet Take 1 tablet (  40 mg total) by mouth daily. 10/13/13  Yes Arnoldo Lenis, MD  potassium chloride SA (K-DUR,KLOR-CON) 20 MEQ tablet Take 1/2 a tablet daily. Patient taking differently: Take 20 mEq by mouth daily.  02/02/13  Yes Lendon Colonel, NP  spironolactone (ALDACTONE) 25 MG tablet Take 25 mg by mouth daily.   Yes Historical Provider, MD  carvedilol (COREG) 25 MG tablet Take 1 tablet (25 mg total) by mouth 2 (two) times daily. 08/21/13   Arnoldo Lenis, MD   BP 101/65 mmHg  Pulse 96  Temp(Src) 97.7 F (36.5 C)  Resp 15  Ht 5\' 10"  (1.778 m)  Wt 228 lb (103.42 kg)  BMI 32.71 kg/m2  SpO2 100% Physical Exam   Constitutional: He appears well-nourished.  HENT:  Head: Atraumatic.  Neck: Neck supple. No JVD present.  Cardiovascular: Normal rate and regular rhythm.   Pulmonary/Chest: He has decreased breath sounds. He has wheezes.  Diffuse quiet wheezes. Decreased breathe sounds. No JVD.   Abdominal: Soft. There is no tenderness.  Musculoskeletal: He exhibits edema.  Moderate severe pitting edema  Neurological: He is alert.  Patient is awake and answer questions but has somewhat difficult speech pattern to understand.  Skin: Skin is warm.    ED Course  Procedures (including critical care time)  COORDINATION OF CARE: 9:12 PM Discussed treatment plan with pt at bedside which includes breathing treatment, CXR, and pt agreed to plan.    Labs Review Labs Reviewed  COMPREHENSIVE METABOLIC PANEL - Abnormal; Notable for the following:    Potassium 5.4 (*)    Glucose, Bld 121 (*)    BUN 27 (*)    Creatinine, Ser 1.61 (*)    Calcium 8.3 (*)    GFR calc non Af Amer 43 (*)    GFR calc Af Amer 49 (*)    All other components within normal limits  TROPONIN I - Abnormal; Notable for the following:    Troponin I 0.15 (*)    All other components within normal limits  BRAIN NATRIURETIC PEPTIDE - Abnormal; Notable for the following:    B Natriuretic Peptide 731.0 (*)    All other components within normal limits  CBC WITH DIFFERENTIAL/PLATELET - Abnormal; Notable for the following:    MCH 24.8 (*)    RDW 16.7 (*)    All other components within normal limits  I-STAT CHEM 8, ED - Abnormal; Notable for the following:    Potassium 5.4 (*)    Chloride 99 (*)    BUN 32 (*)    Creatinine, Ser 1.50 (*)    Glucose, Bld 151 (*)    Calcium, Ion 1.06 (*)    Hemoglobin 18.4 (*)    HCT 54.0 (*)    All other components within normal limits  TRIGLYCERIDES  BLOOD GAS, ARTERIAL  I-STAT TROPOININ, ED    Imaging Review Dg Chest 2 View  09/05/2015  CLINICAL DATA:  Patient with shortness of breath for  multiple days. EXAM: CHEST  2 VIEW COMPARISON:  Chest radiograph 12/19/2014 FINDINGS: Multiple monitoring leads overlie the patient. Low lung volumes. Stable cardiomegaly. Bilateral perihilar and lower lung interstitial pulmonary opacities. Small bilateral pleural effusions. Lateral view nondiagnostic. Thoracic spine degenerative changes. IMPRESSION: Perihilar interstitial opacities may represent pulmonary edema. Infection not excluded. Small bilateral pleural effusions. Cardiomegaly. Electronically Signed   By: Lovey Newcomer M.D.   On: 09/05/2015 23:30   I have personally reviewed and evaluated these images as part of my medical decision-making.  EKG Interpretation   Date/Time:  Tuesday September 06 2015 00:01:58 EDT Ventricular Rate:  96 PR Interval:  177 QRS Duration: 100 QT Interval:  391 QTC Calculation: 494 R Axis:   -51 Text Interpretation:  Sinus rhythm LAE, consider biatrial enlargement Left  anterior fascicular block RSR' in V1 or V2, probably normal variant  Nonspecific T abnrm, anterolateral leads Borderline prolonged QT interval  T waves more peaked. Confirmed by Alvino Chapel  MD, Ovid Curd [phone]) on  09/06/2015 12:09:28 AM      MDM   Final diagnoses:  Hypertensive CHF (congestive heart failure) (HCC)  Acute respiratory failure, unspecified whether with hypoxia or hypercapnia ([place])  Chronic obstructive pulmonary disease with acute exacerbation ([place])    Patient presented with shortness of breath the last few days. Reportedly has not been using his oxygen. May have had a cough. Somewhat difficult history due to patient's speech pattern. Lab work showed mildly elevated potassium. Mildly elevated troponin likely secondary to respiratory issue. Patient became minimally responsive and hypoxic. Pupils were pinpoint and was minimally responsive to pain. Decision was made to intubate the patient was intubated by myself. There are no ICU beds at [facility] or [facility]. The hospitalist at  [facility] will not manage intubated patients. This dust with Dr. Aretha Parrot at [facility] and patient will be transferred there.  CRITICAL CARE Performed by: Mackie Pai Total critical care time: 30 minutes Critical care time was exclusive of separately billable procedures and treating other patients. Critical care was necessary to treat or prevent imminent or life-threatening deterioration. Critical care was time spent personally by me on the following activities: development of treatment plan with patient and/or surrogate as well as nursing, discussions with consultants, evaluation of patient's response to treatment, examination of patient, obtaining history from patient or surrogate, ordering and performing treatments and interventions, ordering and review of laboratory studies, ordering and review of radiographic studies, pulse oximetry and re-evaluation of patient's condition.  INTUBATION Performed by: Mackie Pai  Required items: required blood products, implants, devices, and special equipment available Patient identity confirmed: provided demographic data and hospital-assigned identification number Time out: Immediately prior to procedure a "time out" was called to verify the correct patient, procedure, equipment, support staff and site/side marked as required.  Indications: respiratory failure  Intubation method: Glidescope Laryngoscopy   Preoxygenation: BVM  Sedatives: Etomidate Paralytic: Roccuronium  Tube Size: 7.5 cuffed  Post-procedure assessment: chest rise and ETCO2 monitor Breath sounds: equal and absent over the epigastrium Tube secured with: ETT holder   Patient tolerated the procedure well with no immediate complications.    I personally performed the services described in this documentation, which was scribed in my presence. The recorded information has been reviewed and is accurate.     [name], MD 09/06/15 [phone]

## 2015-09-05 NOTE — ED Notes (Signed)
Pt given 5mg  albuterol neb treatment en route by [facility]

## 2015-09-05 NOTE — ED Notes (Signed)
Pt brought in by [facility] for c/o sob for the last couple of days; pt states he doesn't like using his inhaler or O2 at home because it makes him feel bad; pt has swelling to bilateral feet and is unable to speak in complete sentences;

## 2015-09-05 NOTE — ED Notes (Signed)
Pt's o2 sat was 82 on 4lpm and pt is very hard to wake up. EdP notified.

## 2015-09-06 ENCOUNTER — Emergency Department (HOSPITAL_COMMUNITY): Payer: Medicare HMO

## 2015-09-06 LAB — BLOOD GAS, ARTERIAL
Acid-Base Excess: 4.5 mmol/L — ABNORMAL HIGH (ref 0.0–2.0)
BICARBONATE: 25.7 meq/L — AB (ref 20.0–24.0)
Drawn by: 22223
FIO2: 0.45
LHR: 16 {breaths}/min
MECHVT: 550 mL
O2 SAT: 95.4 %
PCO2 ART: 82 mmHg — AB (ref 35.0–45.0)
PEEP: 5 cmH2O
PO2 ART: 103 mmHg — AB (ref 80.0–100.0)
TCO2: 18.5 mmol/L (ref 0–100)
pH, Arterial: 7.214 — ABNORMAL LOW (ref 7.350–7.450)

## 2015-09-06 LAB — URINALYSIS, ROUTINE W REFLEX MICROSCOPIC
BILIRUBIN URINE: NEGATIVE
Glucose, UA: NEGATIVE mg/dL
Ketones, ur: NEGATIVE mg/dL
NITRITE: NEGATIVE
PH: 5 (ref 5.0–8.0)
SPECIFIC GRAVITY, URINE: 1.02 (ref 1.005–1.030)

## 2015-09-06 LAB — URINE MICROSCOPIC-ADD ON

## 2015-09-06 LAB — I-STAT CHEM 8, ED
BUN: 32 mg/dL — ABNORMAL HIGH (ref 6–20)
CHLORIDE: 99 mmol/L — AB (ref 101–111)
Calcium, Ion: 1.06 mmol/L — ABNORMAL LOW (ref 1.13–1.30)
Creatinine, Ser: 1.5 mg/dL — ABNORMAL HIGH (ref 0.61–1.24)
GLUCOSE: 151 mg/dL — AB (ref 65–99)
HEMATOCRIT: 54 % — AB (ref 39.0–52.0)
Hemoglobin: 18.4 g/dL — ABNORMAL HIGH (ref 13.0–17.0)
POTASSIUM: 5.4 mmol/L — AB (ref 3.5–5.1)
Sodium: 141 mmol/L (ref 135–145)
TCO2: 35 mmol/L (ref 0–100)

## 2015-09-06 LAB — TRIGLYCERIDES: TRIGLYCERIDES: 123 mg/dL (ref <150)

## 2015-09-06 LAB — I-STAT TROPONIN, ED: Troponin i, poc: 0.08 ng/mL (ref 0.00–0.08)

## 2015-09-06 MED ORDER — PROPOFOL 1000 MG/100ML IV EMUL
INTRAVENOUS | Status: AC
  Filled 2015-09-06: qty 100

## 2015-09-06 MED ORDER — FENTANYL CITRATE (PF) 100 MCG/2ML IJ SOLN: 50.0000 ug | INTRAMUSCULAR | Status: DC | PRN

## 2015-09-06 MED ORDER — VANCOMYCIN HCL IN DEXTROSE 1-5 GM/200ML-% IV SOLN
1000.0000 mg | Freq: Once | INTRAVENOUS | Status: AC
  Administered 2015-09-06: 1000 mg via INTRAVENOUS
  Filled 2015-09-06: qty 200

## 2015-09-06 MED ORDER — FENTANYL CITRATE (PF) 2500 MCG/50ML IJ SOLN
10.0000 ug/h | INTRAMUSCULAR | Status: DC
  Administered 2015-09-06: 10 ug/h via INTRAVENOUS
  Filled 2015-09-06: qty 50

## 2015-09-06 MED ORDER — METHYLPREDNISOLONE SODIUM SUCC 125 MG IJ SOLR
125.0000 mg | Freq: Once | INTRAMUSCULAR | Status: AC
  Administered 2015-09-06: 125 mg via INTRAVENOUS
  Filled 2015-09-06: qty 2

## 2015-09-06 MED ORDER — PROPOFOL 1000 MG/100ML IV EMUL
0.0000 ug/kg/min | INTRAVENOUS | Status: DC
  Administered 2015-09-06: 5 ug/kg/min via INTRAVENOUS
  Filled 2015-09-06: qty 100

## 2015-09-06 MED ORDER — FENTANYL CITRATE (PF) 100 MCG/2ML IJ SOLN
50.0000 ug | INTRAMUSCULAR | Status: DC | PRN
  Filled 2015-09-06: qty 2

## 2015-09-06 MED ORDER — SODIUM CHLORIDE 0.9 % IV BOLUS (SEPSIS)
500.0000 mL | Freq: Once | INTRAVENOUS | Status: AC
  Administered 2015-09-06: 03:00:00 via INTRAVENOUS

## 2015-09-06 MED ORDER — PIPERACILLIN-TAZOBACTAM 3.375 G IVPB 30 MIN
3.3750 g | Freq: Once | INTRAVENOUS | Status: AC
  Administered 2015-09-06: 3.375 g via INTRAVENOUS
  Filled 2015-09-06: qty 50

## 2015-09-06 MED ORDER — FENTANYL CITRATE (PF) 2500 MCG/50ML IJ SOLN
INTRAMUSCULAR | Status: AC
  Filled 2015-09-06: qty 50

## 2015-09-06 MED ORDER — FENTANYL CITRATE (PF) 100 MCG/2ML IJ SOLN
100.0000 ug | Freq: Once | INTRAMUSCULAR | Status: AC
Start: 2015-09-06 — End: 2015-09-06
  Administered 2015-09-06: 100 ug via INTRAVENOUS

## 2015-09-06 NOTE — ED Notes (Signed)
Pt kept moving hands toward the et tube, edp notfied and orders for soft wrist restraints placed.

## 2015-09-06 NOTE — ED Provider Notes (Signed)
2:30 AM  Assumed care from Dr. Alvino Chapel.  Pt is a 70 y.o. male with history of CHF, COPD who presented to the emergency room shortness of breath. Found to have a respiratory acidosis, hypercapnia. Chest x-ray shows possible mild pulmonary edema with elevated BNP of 700. Also possible pneumonia seen on x-ray with a rectal temperature of 95.5. Given broad-spectrum antibiotics. Patient became less responsive during his emergency department stay and needed intubation. Patient being transferred to [facility] hospital to ICU. Patient is being sedated with propofol. Nursing staff reported blood pressure slightly dropping which I suspect is somewhat from positive pressure ventilation. We'll give 500 mL IV fluid bolus and start fentanyl drip as well for sedation.   4:50 AM  Pt stable.  Transport at bedside to take patient to [facility].  [place], DO 09/06/15 [phone]

## 2015-09-06 NOTE — ED Notes (Signed)
CRITICAL VALUE ALERT  Critical value received:  PH 7.214, CO2 82.0  Date of notification:  09/06/2015  Time of notification:  0040  Critical value read back:Yes.    Nurse who received alert:  Fabio Neighbors RN  MD notified (1st page):  Dr. Alvino Chapel  Time of first page:  0040

## 2015-09-06 NOTE — ED Notes (Signed)
Pt keeps kicking legs and pulling foley tube, edp notified and orders for ankle restraints placed.

## 2015-09-06 NOTE — ED Notes (Addendum)
Bradley Bates -wife- [phone] wife took pt's clothing home

## 2016-06-11 ENCOUNTER — Emergency Department (HOSPITAL_COMMUNITY): Payer: Medicare HMO

## 2016-06-11 ENCOUNTER — Inpatient Hospital Stay (HOSPITAL_COMMUNITY)
Admission: EM | Admit: 2016-06-11 | Discharge: 2016-06-17 | DRG: 190 | Disposition: A | Discharge status: Home / Self Care | Payer: Medicare HMO | Attending: Family Medicine | Admitting: Family Medicine

## 2016-06-11 ENCOUNTER — Encounter (HOSPITAL_COMMUNITY): Payer: Self-pay | Admitting: Emergency Medicine

## 2016-06-11 ENCOUNTER — Inpatient Hospital Stay (HOSPITAL_COMMUNITY): Payer: Medicare HMO

## 2016-06-11 DIAGNOSIS — N184 Chronic kidney disease, stage 4 (severe): Secondary | ICD-10-CM | POA: Diagnosis present

## 2016-06-11 DIAGNOSIS — J440 Chronic obstructive pulmonary disease with acute lower respiratory infection: Secondary | ICD-10-CM | POA: Diagnosis present

## 2016-06-11 DIAGNOSIS — Z9981 Dependence on supplemental oxygen: Secondary | ICD-10-CM

## 2016-06-11 DIAGNOSIS — N183 Chronic kidney disease, stage 3 unspecified: Secondary | ICD-10-CM | POA: Diagnosis present

## 2016-06-11 DIAGNOSIS — Z833 Family history of diabetes mellitus: Secondary | ICD-10-CM

## 2016-06-11 DIAGNOSIS — Z79899 Other long term (current) drug therapy: Secondary | ICD-10-CM | POA: Diagnosis not present

## 2016-06-11 DIAGNOSIS — Z87891 Personal history of nicotine dependence: Secondary | ICD-10-CM

## 2016-06-11 DIAGNOSIS — R739 Hyperglycemia, unspecified: Secondary | ICD-10-CM | POA: Diagnosis present

## 2016-06-11 DIAGNOSIS — I5033 Acute on chronic diastolic (congestive) heart failure: Secondary | ICD-10-CM | POA: Diagnosis present

## 2016-06-11 DIAGNOSIS — R0602 Shortness of breath: Secondary | ICD-10-CM

## 2016-06-11 DIAGNOSIS — R791 Abnormal coagulation profile: Secondary | ICD-10-CM | POA: Diagnosis present

## 2016-06-11 DIAGNOSIS — J189 Pneumonia, unspecified organism: Secondary | ICD-10-CM | POA: Diagnosis present

## 2016-06-11 DIAGNOSIS — I10 Essential (primary) hypertension: Secondary | ICD-10-CM | POA: Diagnosis not present

## 2016-06-11 DIAGNOSIS — I130 Hypertensive heart and chronic kidney disease with heart failure and stage 1 through stage 4 chronic kidney disease, or unspecified chronic kidney disease: Secondary | ICD-10-CM | POA: Diagnosis present

## 2016-06-11 DIAGNOSIS — Z825 Family history of asthma and other chronic lower respiratory diseases: Secondary | ICD-10-CM

## 2016-06-11 DIAGNOSIS — M6281 Muscle weakness (generalized): Secondary | ICD-10-CM

## 2016-06-11 DIAGNOSIS — J9620 Acute and chronic respiratory failure, unspecified whether with hypoxia or hypercapnia: Secondary | ICD-10-CM | POA: Diagnosis present

## 2016-06-11 DIAGNOSIS — I509 Heart failure, unspecified: Secondary | ICD-10-CM | POA: Diagnosis not present

## 2016-06-11 DIAGNOSIS — E872 Acidosis: Secondary | ICD-10-CM | POA: Diagnosis present

## 2016-06-11 DIAGNOSIS — E785 Hyperlipidemia, unspecified: Secondary | ICD-10-CM | POA: Diagnosis present

## 2016-06-11 DIAGNOSIS — Z7951 Long term (current) use of inhaled steroids: Secondary | ICD-10-CM

## 2016-06-11 DIAGNOSIS — I714 Abdominal aortic aneurysm, without rupture, unspecified: Secondary | ICD-10-CM | POA: Diagnosis present

## 2016-06-11 DIAGNOSIS — Z7982 Long term (current) use of aspirin: Secondary | ICD-10-CM

## 2016-06-11 DIAGNOSIS — R0600 Dyspnea, unspecified: Secondary | ICD-10-CM

## 2016-06-11 DIAGNOSIS — J9621 Acute and chronic respiratory failure with hypoxia: Secondary | ICD-10-CM | POA: Diagnosis present

## 2016-06-11 DIAGNOSIS — J9622 Acute and chronic respiratory failure with hypercapnia: Secondary | ICD-10-CM | POA: Diagnosis present

## 2016-06-11 DIAGNOSIS — I712 Thoracic aortic aneurysm, without rupture: Secondary | ICD-10-CM | POA: Diagnosis present

## 2016-06-11 DIAGNOSIS — J441 Chronic obstructive pulmonary disease with (acute) exacerbation: Principal | ICD-10-CM | POA: Diagnosis present

## 2016-06-11 LAB — COMPREHENSIVE METABOLIC PANEL
ALK PHOS: 57 U/L (ref 38–126)
ALT: 13 U/L — ABNORMAL LOW (ref 17–63)
AST: 20 U/L (ref 15–41)
Albumin: 3.8 g/dL (ref 3.5–5.0)
Anion gap: 8 (ref 5–15)
BUN: 22 mg/dL — AB (ref 6–20)
CALCIUM: 8.9 mg/dL (ref 8.9–10.3)
CHLORIDE: 96 mmol/L — AB (ref 101–111)
CO2: 38 mmol/L — ABNORMAL HIGH (ref 22–32)
CREATININE: 1.43 mg/dL — AB (ref 0.61–1.24)
GFR, EST AFRICAN AMERICAN: 57 mL/min — AB (ref >60)
GFR, EST NON AFRICAN AMERICAN: 49 mL/min — AB (ref >60)
Glucose, Bld: 128 mg/dL — ABNORMAL HIGH (ref 65–99)
Potassium: 3.8 mmol/L (ref 3.5–5.1)
Sodium: 142 mmol/L (ref 135–145)
Total Bilirubin: 0.7 mg/dL (ref 0.3–1.2)
Total Protein: 7.4 g/dL (ref 6.5–8.1)

## 2016-06-11 LAB — BLOOD GAS, ARTERIAL
ACID-BASE EXCESS: 7.7 mmol/L — AB (ref 0.0–2.0)
Acid-Base Excess: 10.2 mmol/L — ABNORMAL HIGH (ref 0.0–2.0)
BICARBONATE: 27.8 mmol/L (ref 20.0–28.0)
Bicarbonate: 30.7 mmol/L — ABNORMAL HIGH (ref 20.0–28.0)
DRAWN BY: 23534
Delivery systems: POSITIVE
Drawn by: 23534
EXPIRATORY PAP: 6
FIO2: 0.4
Inspiratory PAP: 15
Mode: POSITIVE
O2 CONTENT: 3 L/min
O2 Content: 40 L/min
O2 Saturation: 82.1 %
O2 Saturation: 85.9 %
PH ART: 7.23 — AB (ref 7.350–7.450)
pCO2 arterial: 78.4 mmHg (ref 32.0–48.0)
pCO2 arterial: 86.9 mmHg (ref 32.0–48.0)
pH, Arterial: 7.293 — ABNORMAL LOW (ref 7.350–7.450)
pO2, Arterial: 56.9 mmHg — ABNORMAL LOW (ref 83.0–108.0)
pO2, Arterial: 63 mmHg — ABNORMAL LOW (ref 83.0–108.0)

## 2016-06-11 LAB — MRSA PCR SCREENING: MRSA by PCR: NEGATIVE

## 2016-06-11 LAB — CBC
HEMATOCRIT: 46.2 % (ref 39.0–52.0)
HEMOGLOBIN: 14.2 g/dL (ref 13.0–17.0)
MCH: 26.8 pg (ref 26.0–34.0)
MCHC: 30.7 g/dL (ref 30.0–36.0)
MCV: 87.3 fL (ref 78.0–100.0)
Platelets: 173 10*3/uL (ref 150–400)
RBC: 5.29 MIL/uL (ref 4.22–5.81)
RDW: 16.7 % — ABNORMAL HIGH (ref 11.5–15.5)
WBC: 7.1 10*3/uL (ref 4.0–10.5)

## 2016-06-11 LAB — BRAIN NATRIURETIC PEPTIDE: B NATRIURETIC PEPTIDE 5: 350 pg/mL — AB (ref 0.0–100.0)

## 2016-06-11 LAB — I-STAT TROPONIN, ED: TROPONIN I, POC: 0.03 ng/mL (ref 0.00–0.08)

## 2016-06-11 LAB — D-DIMER, QUANTITATIVE (NOT AT ARMC): D DIMER QUANT: 0.92 ug{FEU}/mL — AB (ref 0.00–0.50)

## 2016-06-11 LAB — TSH: TSH: 0.776 u[IU]/mL (ref 0.350–4.500)

## 2016-06-11 MED ORDER — LACTATED RINGERS IV SOLN
INTRAVENOUS | Status: DC
  Administered 2016-06-12: via INTRAVENOUS

## 2016-06-11 MED ORDER — HYDRALAZINE HCL 20 MG/ML IJ SOLN
10.0000 mg | INTRAMUSCULAR | Status: DC | PRN
  Administered 2016-06-12: 10 mg via INTRAVENOUS
  Filled 2016-06-11: qty 1

## 2016-06-11 MED ORDER — BUDESONIDE 0.5 MG/2ML IN SUSP
0.5000 mg | Freq: Two times a day (BID) | RESPIRATORY_TRACT | Status: DC
  Administered 2016-06-11 – 2016-06-13 (×5): 0.5 mg via RESPIRATORY_TRACT
  Filled 2016-06-11 (×5): qty 2

## 2016-06-11 MED ORDER — SENNOSIDES-DOCUSATE SODIUM 8.6-50 MG PO TABS: 1.0000 | ORAL_TABLET | Freq: Every evening | ORAL | Status: DC | PRN

## 2016-06-11 MED ORDER — BISACODYL 5 MG PO TBEC
5.0000 mg | DELAYED_RELEASE_TABLET | Freq: Every day | ORAL | Status: DC | PRN
  Administered 2016-06-16: 5 mg via ORAL
  Filled 2016-06-11: qty 1

## 2016-06-11 MED ORDER — METHYLPREDNISOLONE SODIUM SUCC 125 MG IJ SOLR
80.0000 mg | Freq: Two times a day (BID) | INTRAMUSCULAR | Status: DC
  Administered 2016-06-11: 80 mg via INTRAVENOUS
  Filled 2016-06-11: qty 2

## 2016-06-11 MED ORDER — ATORVASTATIN CALCIUM 40 MG PO TABS
80.0000 mg | ORAL_TABLET | Freq: Every day | ORAL | Status: DC
  Administered 2016-06-12 – 2016-06-16 (×5): 80 mg via ORAL
  Filled 2016-06-11 (×6): qty 2

## 2016-06-11 MED ORDER — ISOSORBIDE MONONITRATE ER 60 MG PO TB24
30.0000 mg | ORAL_TABLET | Freq: Every day | ORAL | Status: DC
  Administered 2016-06-12 – 2016-06-17 (×6): 30 mg via ORAL
  Filled 2016-06-11 (×7): qty 1

## 2016-06-11 MED ORDER — ENOXAPARIN SODIUM 40 MG/0.4ML ~~LOC~~ SOLN
40.0000 mg | SUBCUTANEOUS | Status: DC
  Administered 2016-06-11 – 2016-06-16 (×6): 40 mg via SUBCUTANEOUS
  Filled 2016-06-11 (×6): qty 0.4

## 2016-06-11 MED ORDER — ALBUTEROL SULFATE (2.5 MG/3ML) 0.083% IN NEBU
5.0000 mg | INHALATION_SOLUTION | Freq: Once | RESPIRATORY_TRACT | Status: AC
  Administered 2016-06-11: 5 mg via RESPIRATORY_TRACT
  Filled 2016-06-11: qty 6

## 2016-06-11 MED ORDER — ACETAMINOPHEN 325 MG PO TABS: 650.0000 mg | ORAL_TABLET | Freq: Four times a day (QID) | ORAL | Status: DC | PRN

## 2016-06-11 MED ORDER — SODIUM CHLORIDE 0.9% FLUSH
3.0000 mL | Freq: Two times a day (BID) | INTRAVENOUS | Status: DC
  Administered 2016-06-12: 3 mL via INTRAVENOUS

## 2016-06-11 MED ORDER — METHYLPREDNISOLONE SODIUM SUCC 125 MG IJ SOLR
125.0000 mg | Freq: Once | INTRAMUSCULAR | Status: AC
  Administered 2016-06-11: 125 mg via INTRAVENOUS
  Filled 2016-06-11: qty 2

## 2016-06-11 MED ORDER — ARFORMOTEROL TARTRATE 15 MCG/2ML IN NEBU
2.0000 mL | INHALATION_SOLUTION | Freq: Two times a day (BID) | RESPIRATORY_TRACT | Status: DC
  Administered 2016-06-11 – 2016-06-13 (×4): 15 ug via RESPIRATORY_TRACT
  Filled 2016-06-11 (×5): qty 2

## 2016-06-11 MED ORDER — NICARDIPINE HCL IN NACL 20-0.86 MG/200ML-% IV SOLN
3.0000 mg/h | Freq: Once | INTRAVENOUS | Status: DC
  Filled 2016-06-11: qty 200

## 2016-06-11 MED ORDER — IOPAMIDOL (ISOVUE-370) INJECTION 76%
100.0000 mL | Freq: Once | INTRAVENOUS | Status: AC | PRN
  Administered 2016-06-12: 100 mL via INTRAVENOUS

## 2016-06-11 MED ORDER — ALBUTEROL SULFATE (2.5 MG/3ML) 0.083% IN NEBU
2.5000 mg | INHALATION_SOLUTION | RESPIRATORY_TRACT | Status: DC | PRN
Start: 2016-06-11 — End: 2016-06-17

## 2016-06-11 MED ORDER — LISINOPRIL 10 MG PO TABS: 40.0000 mg | ORAL_TABLET | Freq: Every day | ORAL | Status: DC

## 2016-06-11 MED ORDER — IPRATROPIUM-ALBUTEROL 0.5-2.5 (3) MG/3ML IN SOLN
3.0000 mL | Freq: Four times a day (QID) | RESPIRATORY_TRACT | Status: DC
  Administered 2016-06-11 – 2016-06-12 (×5): 3 mL via RESPIRATORY_TRACT
  Filled 2016-06-11 (×3): qty 3

## 2016-06-11 MED ORDER — ONDANSETRON HCL 4 MG/2ML IJ SOLN: 4.0000 mg | Freq: Four times a day (QID) | INTRAMUSCULAR | Status: DC | PRN

## 2016-06-11 MED ORDER — ONDANSETRON HCL 4 MG PO TABS: 4.0000 mg | ORAL_TABLET | Freq: Four times a day (QID) | ORAL | Status: DC | PRN

## 2016-06-11 MED ORDER — ASPIRIN EC 81 MG PO TBEC
81.0000 mg | DELAYED_RELEASE_TABLET | Freq: Every day | ORAL | Status: DC
  Administered 2016-06-12 – 2016-06-17 (×6): 81 mg via ORAL
  Filled 2016-06-11 (×7): qty 1

## 2016-06-11 MED ORDER — FLEET ENEMA 7-19 GM/118ML RE ENEM: 1.0000 | ENEMA | Freq: Once | RECTAL | Status: DC | PRN

## 2016-06-11 MED ORDER — METOPROLOL TARTRATE 25 MG PO TABS
25.0000 mg | ORAL_TABLET | Freq: Two times a day (BID) | ORAL | Status: DC
  Administered 2016-06-12 – 2016-06-17 (×11): 25 mg via ORAL
  Filled 2016-06-11 (×12): qty 1

## 2016-06-11 MED ORDER — ACETAMINOPHEN 650 MG RE SUPP: 650.0000 mg | Freq: Four times a day (QID) | RECTAL | Status: DC | PRN

## 2016-06-11 NOTE — ED Triage Notes (Signed)
Pt 87% on 4L, place on non- rebreather

## 2016-06-11 NOTE — ED Notes (Signed)
Patient on bipap doing well, family at the bedside

## 2016-06-11 NOTE — ED Provider Notes (Signed)
[facility] DEPT Provider Note   CSN: [id number] Arrival date & time: 06/11/16  1210   By signing my name below, I, Bradley Bates, attest that this documentation has been prepared under the direction and in the presence of Bradley Boss, MD. Electronically Signed: Hilbert Bates, Scribe. 06/11/16. 1:14 PM. History   Chief Complaint Chief Complaint  Patient presents with  . Shortness of Breath     The history is provided by the patient. No language interpreter was used.    HPI Comments: Bradley Bates is a 71 y.o. male with a hx of COPD was brought in by ambulance, who presents to the Emergency Department complaining of progressively worsening SOB for the past 3 days. Per EMS: Patient was given 10mg  of albuterol en route to the ED with significant improvement. He states that yesterday the SOB almost became unbearable. He also reports a productive cough. He denies any tobacco use for the past year. He is currently on 2L of oxygen at home and uses albuterol daily. He reports compliance with all of his medications. He has been eating and drinking okay. Past Medical History:  Diagnosis Date  . Bronchitis   . Congestive heart failure (CHF) ([place])   . COPD (chronic obstructive pulmonary disease) ([place])   . Hypercholesteremia   . Hypertension   . Shortness of breath     Patient Active Problem List   Diagnosis Date Noted  . HTN (hypertension), malignant 12/19/2014  . Medically noncompliant 12/19/2014  . Hypertensive CHF (congestive heart failure) ([place]) 12/19/2014  . Tobacco abuse 12/16/2012  . Acute respiratory failure ([place]) 12/15/2012  . Acute decompensated heart failure ([place]) 12/15/2012  . HTN (hypertension) 12/15/2012  . Thrombocytopenia ([place]) 12/15/2012  . Anemia 12/15/2012  . Elevated transaminase level 12/15/2012  . Bronchitis     Past Surgical History:  Procedure Laterality Date  . COLONOSCOPY N/A 08/17/2013   Procedure: COLONOSCOPY;  Surgeon: Danie Binder, MD;   Location[facility];  Service: Endoscopy;  Laterality: N/A;  8:30 AM  . NO PAST SURGERIES         Home Medications    Prior to Admission medications   Medication Sig Start Date End Date Taking? Authorizing Provider  albuterol (PROVENTIL) (2.5 MG/3ML) 0.083% nebulizer solution Take 2.5 mg by nebulization every 6 (six) hours as needed for wheezing.  10/03/15  Yes Historical Provider, MD  arformoterol (BROVANA) 15 MCG/2ML NEBU Take 2 mLs by nebulization 2 (two) times daily. 10/03/15  Yes Historical Provider, MD  aspirin EC 81 MG EC tablet Take 1 tablet (81 mg total) by mouth daily. 12/21/14  Yes Orvan Falconer, MD  atorvastatin (LIPITOR) 40 MG tablet Take 1 tablet (40 mg total) by mouth daily. Patient taking differently: Take 80 mg by mouth daily.  12/28/13  Yes Arnoldo Lenis, MD  budesonide (PULMICORT) 0.5 MG/2ML nebulizer solution Take 0.5 mg by nebulization every 12 (twelve) hours. 10/03/15 10/02/16 Yes Historical Provider, MD  furosemide (LASIX) 40 MG tablet TAKE ONE TABLET BY MOUTH TWICE DAILY *PLEASE CALL AND MAKE APPOINTMENT WITH DR. OFFICE FOR FURTHER REFILLS: [id number]* 12/24/13  Yes Arnoldo Lenis, MD  ipratropium (ATROVENT) 0.02 % nebulizer solution Take 0.5 mg by nebulization 3 (three) times daily.   Yes Historical Provider, MD  ipratropium-albuterol (DUONEB) 0.5-2.5 (3) MG/3ML SOLN Take 3 mLs by nebulization 3 (three) times daily.   Yes Historical Provider, MD  isosorbide mononitrate (IMDUR) 30 MG 24 hr tablet Take 1 tablet (30 mg total) by mouth daily. 10/21/13  Yes Lendon Colonel, NP  lisinopril (PRINIVIL,ZESTRIL) 40 MG tablet Take 1 tablet (40 mg total) by mouth daily. 10/13/13  Yes Arnoldo Lenis, MD  metoprolol tartrate (LOPRESSOR) 25 MG tablet Take 25 mg by mouth 2 (two) times daily.   Yes Historical Provider, MD  potassium chloride SA (K-DUR,KLOR-CON) 20 MEQ tablet Take 1/2 a tablet daily. Patient taking differently: Take 20 mEq by mouth daily.  02/02/13  Yes Lendon Colonel, NP  spironolactone (ALDACTONE) 25 MG tablet Take 25 mg by mouth daily.   Yes Historical Provider, MD  albuterol (PROVENTIL HFA;VENTOLIN HFA) 108 (90 BASE) MCG/ACT inhaler Inhale 2 puffs into the lungs every 6 (six) hours as needed for shortness of breath.     Historical Provider, MD  carvedilol (COREG) 25 MG tablet Take 1 tablet (25 mg total) by mouth 2 (two) times daily. Patient not taking: Reported on 06/11/2016 08/21/13   Arnoldo Lenis, MD    Family History Family History  Problem Relation Age of Onset  . Diabetes Brother   . Lung cancer Mother   . Cancer Brother   . Colon cancer Neg Hx     Social History Social History  Substance Use Topics  . Smoking status: Former Smoker    Packs/day: 0.00    Years: 35.00    Quit date: 05/24/2015  . Smokeless tobacco: Never Used  . Alcohol use No     Allergies   Patient has no known allergies.   Review of Systems Review of Systems  Constitutional: Positive for fatigue. Negative for appetite change, chills, fever and unexpected weight change.  HENT: Negative.   Eyes: Negative.   Respiratory: Positive for cough and shortness of breath.   Gastrointestinal: Negative.  Negative for abdominal pain.  Endocrine: Negative.   Genitourinary: Negative.   Musculoskeletal: Negative.   Allergic/Immunologic: Negative.   Neurological: Negative.   Hematological: Negative.   Psychiatric/Behavioral: Negative.   All other systems reviewed and are negative.    Physical Exam Updated Vital Signs BP (!) 203/89   Pulse 95   Temp 97.7 F (36.5 C) (Oral)   Resp 24   Ht 5' 10.5" (1.791 m)   Wt 230 lb (104.3 kg)   SpO2 97%   BMI 32.54 kg/m   Physical Exam  Constitutional: He is oriented to person, place, and time. He appears well-developed and well-nourished.  HENT:  Head: Normocephalic.  Right Ear: External ear normal.  Left Ear: External ear normal.  Nose: Nose normal.  Mouth/Throat: Oropharynx is clear and moist.  Eyes:  Conjunctivae and EOM are normal. Pupils are equal, round, and reactive to light.  Neck: Normal range of motion.  Cardiovascular: Normal rate and regular rhythm.   Pulmonary/Chest: He is in respiratory distress. He has wheezes. He has no rales.  Diffusely decreased breaths sounds and increased wob  Abdominal: Soft. Bowel sounds are normal. He exhibits no distension.  Musculoskeletal: Normal range of motion. He exhibits edema.  Trace edema  Neurological: He is alert and oriented to person, place, and time.  Skin: Skin is warm. Capillary refill takes less than 2 seconds.  Psychiatric: He has a normal mood and affect. His behavior is normal.  Nursing note and vitals reviewed.    ED Treatments / Results  DIAGNOSTIC STUDIES: Oxygen Saturation is 97% on RA, normal by my interpretation.    COORDINATION OF CARE: 12:34 PM Discussed treatment plan with pt at bedside, which includes EKG and labs, and pt agreed to plan.  Labs (all labs ordered are listed, but only abnormal results are displayed) Labs Reviewed  BLOOD GAS, ARTERIAL  CBC  COMPREHENSIVE METABOLIC PANEL  BRAIN NATRIURETIC PEPTIDE  I-STAT [place], ED    EKG  EKG Interpretation  Date/Time:  Monday June 11 2016 12:17:43 EST Ventricular Rate:  95 PR Interval:    QRS Duration: 104 QT Interval:  399 QTC Calculation: 502 R Axis:   -29 Text Interpretation:  Sinus rhythm Right atrial enlargement Abnormal R-wave progression, early transition LVH with secondary repolarization abnormality Prolonged QT interval No significant change since last tracing Confirmed by Krish Bailly MD, Andee Poles [id number]) on 06/11/2016 12:36:49 PM       Radiology No results found.  Procedures Procedures (including critical care time)  Medications Ordered in ED Medications  albuterol (PROVENTIL) (2.5 MG/3ML) 0.083% nebulizer solution 5 mg (not administered)  methylPREDNISolone sodium succinate (SOLU-MEDROL) 125 mg/2 mL injection 125 mg (125 mg Intravenous  Given 06/11/16 1252)     Initial Impression / Assessment and Plan / ED Course  I have reviewed the triage vital signs and the nursing notes.  Pertinent labs & imaging results that were available during my care of the patient were reviewed by me and considered in my medical decision making (see chart for details).  Clinical Course as of Jun 13 1516  Mon Jun 11, 2016  1534 WBC: 7.1 [DR]    Clinical Course User Index [DR] Bradley Boss, MD  1- dyspnea- copd exacerbation- patient given solumedrol, albuterol, on bipap after abg on oxygen with ph 7.29/78/56.  Flu sent  2- hypertension  With history of hypertensive urgency.  BP down to 172 from 202 without intervention.  Nicardipine ordered but held.  3- creatinine at 1.43- stable from first prior    Plan admission for ongoing therapy and evaluation.    Final Clinical Impressions(s) / ED Diagnoses   Final diagnoses:  SOB (shortness of breath)    New Prescriptions New Prescriptions   No medications on file   I personally performed the services described in this documentation, which was scribed in my presence. The recorded information has been reviewed and considered.    Bradley Boss, MD 06/13/16 1520

## 2016-06-11 NOTE — ED Notes (Signed)
Called RT

## 2016-06-11 NOTE — ED Notes (Signed)
Rt and Lab at the bedside

## 2016-06-11 NOTE — H&P (Signed)
History and Physical    Bradley Bates [id number] DOB: May 19, 1945 DOA: 06/11/2016  PCP: [facility] Consultants:  None Patient coming from: home - lives with wife and her son; Bradley Bates: wife, [phone]  Chief Complaint: SOB  HPI: Bradley Bates is a 71 y.o. male with medical history significant of COPD on home O2, HTN, HLD, and CHF (preserved EF, grade 1 diastolic dysfunction in [id number]) presenting because he has been SOB, very difficult to breathe.  Using the dryer, cooking makes it hard for him to breathe and he has to go outside.  It has been going on for months to years.  Has been progressively worsening, maybe over 3 weeks or so.  No sick contacts.  No fever.  No cough.  No LE edema.   ED Course: Per Dr. Jeanell Sparrow: 1- dyspnea- copd exacerbation- patient given solumedrol, albuterol, on bipap after abg on oxygen with ph 7.29/78/56. Flu sent  2- hypertension With history of hypertensive urgency. BP down to 172 from 202 without intervention. Nicardipine ordered but held.  3- creatinine at 1.43- stable from first prior  Plan admission for ongoing therapy and evaluation   Review of Systems: As per HPI; otherwise 10 point review of systems reviewed and negative.  Limited by patient with somnolence, wearing BIPAP.  Ambulatory Status:  Ambulates without assistance  Past Medical History:  Diagnosis Date  . Bronchitis   . Congestive heart failure (CHF) ([place])   . COPD (chronic obstructive pulmonary disease) ([place])    supposed to wear home O2 but does not always wear it, 2L [place] O2  . Hypercholesteremia   . Hypertension   . Shortness of breath     Past Surgical History:  Procedure Laterality Date  . COLONOSCOPY N/A 08/17/2013   Procedure: COLONOSCOPY;  Surgeon: Danie Binder, MD;  Location: [facility];  Service: Endoscopy;  Laterality: N/A;  8:30 AM  . NO PAST SURGERIES      Social History   Social History  . Marital status: Married    Spouse name: N/A  . Number of  children: N/A  . Years of education: N/A   Occupational History  . retired    Social History Main Topics  . Smoking status: Former Smoker    Packs/day: 0.00    Years: 35.00    Quit date: 05/24/2015  . Smokeless tobacco: Never Used  . Alcohol use No  . Drug use: No  . Sexual activity: Yes   Other Topics Concern  . Not on file   Social History Narrative  . No narrative on file    No Known Allergies  Family History  Problem Relation Age of Onset  . Diabetes Brother   . Lung cancer Mother   . Cancer Brother   . Colon cancer Neg Hx     Prior to Admission medications   Medication Sig Start Date End Date Taking? Authorizing Provider  albuterol (PROVENTIL) (2.5 MG/3ML) 0.083% nebulizer solution Take 2.5 mg by nebulization every 6 (six) hours as needed for wheezing.  10/03/15  Yes Historical Provider, MD  arformoterol (BROVANA) 15 MCG/2ML NEBU Take 2 mLs by nebulization 2 (two) times daily. 10/03/15  Yes Historical Provider, MD  aspirin EC 81 MG EC tablet Take 1 tablet (81 mg total) by mouth daily. 12/21/14  Yes Orvan Falconer, MD  atorvastatin (LIPITOR) 40 MG tablet Take 1 tablet (40 mg total) by mouth daily. Patient taking differently: Take 80 mg by mouth daily.  12/28/13  Yes Arnoldo Lenis, MD  budesonide (PULMICORT) 0.5 MG/2ML nebulizer solution Take 0.5 mg by nebulization every 12 (twelve) hours. 10/03/15 10/02/16 Yes Historical Provider, MD  furosemide (LASIX) 40 MG tablet TAKE ONE TABLET BY MOUTH TWICE DAILY *PLEASE CALL AND MAKE APPOINTMENT WITH DR. OFFICE FOR FURTHER REFILLS: [id number]* 12/24/13  Yes Arnoldo Lenis, MD  ipratropium (ATROVENT) 0.02 % nebulizer solution Take 0.5 mg by nebulization 3 (three) times daily.   Yes Historical Provider, MD  ipratropium-albuterol (DUONEB) 0.5-2.5 (3) MG/3ML SOLN Take 3 mLs by nebulization 3 (three) times daily.   Yes Historical Provider, MD  isosorbide mononitrate (IMDUR) 30 MG 24 hr tablet Take 1 tablet (30 mg total) by mouth daily. 10/21/13   Yes Lendon Colonel, NP  lisinopril (PRINIVIL,ZESTRIL) 40 MG tablet Take 1 tablet (40 mg total) by mouth daily. 10/13/13  Yes Arnoldo Lenis, MD  metoprolol tartrate (LOPRESSOR) 25 MG tablet Take 25 mg by mouth 2 (two) times daily.   Yes Historical Provider, MD  potassium chloride SA (K-DUR,KLOR-CON) 20 MEQ tablet Take 1/2 a tablet daily. Patient taking differently: Take 20 mEq by mouth daily.  02/02/13  Yes Lendon Colonel, NP  spironolactone (ALDACTONE) 25 MG tablet Take 25 mg by mouth daily.   Yes Historical Provider, MD  albuterol (PROVENTIL HFA;VENTOLIN HFA) 108 (90 BASE) MCG/ACT inhaler Inhale 2 puffs into the lungs every 6 (six) hours as needed for shortness of breath.     Historical Provider, MD  carvedilol (COREG) 25 MG tablet Take 1 tablet (25 mg total) by mouth 2 (two) times daily. Patient not taking: Reported on 06/11/2016 08/21/13   Arnoldo Lenis, MD    Physical Exam: Vitals:   06/11/16 2000 06/11/16 2050 06/11/16 2130 06/11/16 2153  BP: (!) 159/81     Pulse: 86     Resp: 23     Temp: 98.2 F (36.8 C)     TempSrc: Axillary     SpO2: 90% 91% 95% 92%  Weight: 110.4 kg (243 lb 6.2 oz)     Height: 5' 10.5" (1.791 m)        General:  Appears calm and comfortable and is NAD on BIPAP Eyes:  PERRL, EOMI, normal lids, iris ENT:  grossly normal hearing, lips & tongue, mmm Neck:  no LAD, masses or thyromegaly Cardiovascular:  RRR, no m/r/g. No LE edema.  Respiratory:  CTA bilaterally, no w/r/r. Normal respiratory effort. Abdomen:  soft, ntnd, NABS Skin:  no rash or induration seen on limited exam Musculoskeletal:  grossly normal tone BUE/BLE, good ROM, no bony abnormality Psychiatric:  grossly normal mood and affect, speech fluent and appropriate, AOx3 Neurologic:  CN 2-12 grossly intact, moves all extremities in coordinated fashion, sensation intact  Labs on Admission: I have personally reviewed following labs and imaging studies  CBC:  Recent Labs Lab  06/11/16 1320  WBC 7.1  HGB 14.2  HCT 46.2  MCV 87.3  PLT [id number]   Basic Metabolic Panel:  Recent Labs Lab 06/11/16 1320  NA 142  K 3.8  CL 96*  CO2 38*  GLUCOSE 128*  BUN 22*  CREATININE 1.43*  CALCIUM 8.9   GFR: Estimated Creatinine Clearance: 62 mL/min (by C-G formula based on SCr of 1.43 mg/dL (H)). Liver Function Tests:  Recent Labs Lab 06/11/16 1320  AST 20  ALT 13*  ALKPHOS 57  BILITOT 0.7  PROT 7.4  ALBUMIN 3.8   No results for input(s): LIPASE, AMYLASE in the last 168 hours. No  results for input(s): AMMONIA in the last 168 hours. Coagulation Profile: No results for input(s): INR, PROTIME in the last 168 hours. Cardiac Enzymes: No results for input(s): CKTOTAL, CKMB, CKMBINDEX, TROPONINI in the last 168 hours. BNP (last 3 results) No results for input(s): PROBNP in the last 8760 hours. HbA1C: No results for input(s): HGBA1C in the last 72 hours. CBG: No results for input(s): GLUCAP in the last 168 hours. Lipid Profile: No results for input(s): CHOL, HDL, LDLCALC, TRIG, CHOLHDL, LDLDIRECT in the last 72 hours. Thyroid Function Tests:  Recent Labs  06/11/16 1321  TSH 0.776   Anemia Panel: No results for input(s): VITAMINB12, FOLATE, FERRITIN, TIBC, IRON, RETICCTPCT in the last 72 hours. Urine analysis:    Component Value Date/Time   COLORURINE RED (A) 09/06/2015 0032   APPEARANCEUR HAZY (A) 09/06/2015 0032   LABSPEC 1.020 09/06/2015 0032   PHURINE 5.0 09/06/2015 0032   GLUCOSEU NEGATIVE 09/06/2015 0032   HGBUR LARGE (A) 09/06/2015 0032   BILIRUBINUR NEGATIVE 09/06/2015 0032   KETONESUR NEGATIVE 09/06/2015 0032   PROTEINUR TRACE (A) 09/06/2015 0032   UROBILINOGEN 0.2 12/15/2012 1051   NITRITE NEGATIVE 09/06/2015 0032   LEUKOCYTESUR TRACE (A) 09/06/2015 0032    Creatinine Clearance: Estimated Creatinine Clearance: 62 mL/min (by C-G formula based on SCr of 1.43 mg/dL (H)).  Sepsis Labs: @LABRCNTIP (procalcitonin:4,lacticidven:4) )No  results found for this or any previous visit (from the past 240 hour(s)).   Radiological Exams on Admission: Dg Chest Port 1 View  Result Date: 06/11/2016 CLINICAL DATA:  Shortness of breath for 3-4 years, worsened last night. EXAM: PORTABLE CHEST 1 VIEW COMPARISON:  Single-view of the chest 09/06/2015. PA and lateral chest 12/19/2012. FINDINGS: There is cardiomegaly without edema. No consolidative process, pneumothorax or effusion. IMPRESSION: Cardiomegaly without acute disease. Electronically Signed   By: Inge Rise M.D.   On: 06/11/2016 13:53    EKG: Independently reviewed.  NSR with rate 97; prolonged QT 502; nonspecific ST changes with no evidence of acute ischemia, NSCSLT  Assessment/Plan Principal Problem:   Acute on chronic respiratory failure (HCC) Active Problems:   HTN (hypertension), malignant   COPD exacerbation (HCC)   CKD (chronic kidney disease), stage III   Respiratory failure -Patient with progressive respiratory failure with acute exacerbation -Does not have recent URI symptoms, denies cough -+wheezing -ABG at 1326 - 7.293/78.4/56.9/30.7/82.1% -Repeat on BIPAP at 1723 - 7.230/86.9/63.0/27.8/85.9% -Elevated D-dimer - will order CTA STAT -BNP actually lower than usual baseline -Will continue BIPAP, repeat ABG now and in AM -Patient may require intubation if gas is not improving, but he does appear comfortable at the time of this evaluation -Respiratory panel and flu test pending -Negative CXR -With lack of cough, fever, it seems futile to order blood/sputum cultures -No antibiotics are indicated at this time -Will give Solumedrol 80 mg BID -Duonebs q6h and prn Albuterol q2h -Continue home meds - Brovana, Pulmicort -Should not need both Atrovent and Duonebs so Atrovent was not ordered -Admit to SDU -Pulmonology consult in AM  HTN -Poor control in the ER -Continue Lopressor, Lisinopril -Hold Aldactone, Lasix for now -Use Hydralazine  prn  CKD -Creatinine 1.43 -this appears to be slightly better than usual baseline  Hyperglycemia -Glucose 128 -No prior A1c seen in Epic, will order   DVT prophylaxis: Lovenox  Code Status:  Full - confirmed with patient/family Family Communication: Wife present throughout evaluation Disposition Plan: Home once clinically improved Consults called: Pulmonology, CM, Nutrition, PT, OT Admission status: Admit - It is my clinical  opinion that admission to INPATIENT is reasonable and necessary because this patient will require at least 2 midnights in the hospital to treat this condition based on the medical complexity of the problems presented.  Given the aforementioned information, the predictability of an adverse outcome is felt to be significant.    Karmen Bongo MD [facility]  If 7PM-7AM, please contact night-coverage [email or web address] Password [email or web address]  06/11/2016, 10:50 PM

## 2016-06-11 NOTE — ED Notes (Signed)
Called RT for bipap

## 2016-06-11 NOTE — ED Notes (Signed)
CRITICAL VALUE ALERT  Critical value received:    PH 7.23 PCO2 86.9 PO2 63.0 HCO3 27.8 F02 85.9  Date of notification:  06/11/16  Time of notification:  [id number]  Critical value read back:Yes.    Nurse who received alert:  Charmayne Sheer, RN  MD notified (1st page):  Pattricia Boss

## 2016-06-11 NOTE — ED Triage Notes (Signed)
EMS called out for SOB, worse today, started 3 days ago, hx COPD, given 10Mg  albuterol in route, Pt uses 2L of 02 at home

## 2016-06-11 NOTE — ED Notes (Signed)
RT at the bedside, pt ready for transport

## 2016-06-12 LAB — CBC
HCT: 46.3 % (ref 39.0–52.0)
Hemoglobin: 14.1 g/dL (ref 13.0–17.0)
MCH: 26.4 pg (ref 26.0–34.0)
MCHC: 30.5 g/dL (ref 30.0–36.0)
MCV: 86.5 fL (ref 78.0–100.0)
PLATELETS: 166 10*3/uL (ref 150–400)
RBC: 5.35 MIL/uL (ref 4.22–5.81)
RDW: 17.7 % — AB (ref 11.5–15.5)
WBC: 7 10*3/uL (ref 4.0–10.5)

## 2016-06-12 LAB — RESPIRATORY PANEL BY PCR
ADENOVIRUS-RVPPCR: NOT DETECTED
BORDETELLA PERTUSSIS-RVPCR: NOT DETECTED
CHLAMYDOPHILA PNEUMONIAE-RVPPCR: NOT DETECTED
Coronavirus 229E: NOT DETECTED
Coronavirus HKU1: NOT DETECTED
Coronavirus NL63: NOT DETECTED
Coronavirus OC43: NOT DETECTED
INFLUENZA A-RVPPCR: NOT DETECTED
INFLUENZA B-RVPPCR: NOT DETECTED
Metapneumovirus: NOT DETECTED
Mycoplasma pneumoniae: NOT DETECTED
PARAINFLUENZA VIRUS 3-RVPPCR: NOT DETECTED
PARAINFLUENZA VIRUS 4-RVPPCR: NOT DETECTED
Parainfluenza Virus 1: NOT DETECTED
Parainfluenza Virus 2: NOT DETECTED
RESPIRATORY SYNCYTIAL VIRUS-RVPPCR: NOT DETECTED
RHINOVIRUS / ENTEROVIRUS - RVPPCR: NOT DETECTED

## 2016-06-12 LAB — BASIC METABOLIC PANEL
Anion gap: 8 (ref 5–15)
BUN: 26 mg/dL — AB (ref 6–20)
CALCIUM: 8.6 mg/dL — AB (ref 8.9–10.3)
CHLORIDE: 98 mmol/L — AB (ref 101–111)
CO2: 34 mmol/L — AB (ref 22–32)
CREATININE: 1.27 mg/dL — AB (ref 0.61–1.24)
GFR calc non Af Amer: 56 mL/min — ABNORMAL LOW (ref >60)
Glucose, Bld: 104 mg/dL — ABNORMAL HIGH (ref 65–99)
Potassium: 4.6 mmol/L (ref 3.5–5.1)
Sodium: 140 mmol/L (ref 135–145)

## 2016-06-12 LAB — BLOOD GAS, ARTERIAL
ACID-BASE EXCESS: 7.4 mmol/L — AB (ref 0.0–2.0)
Acid-Base Excess: 10.8 mmol/L — ABNORMAL HIGH (ref 0.0–2.0)
Bicarbonate: 29 mmol/L — ABNORMAL HIGH (ref 20.0–28.0)
Bicarbonate: 31.5 mmol/L — ABNORMAL HIGH (ref 20.0–28.0)
DELIVERY SYSTEMS: POSITIVE
DRAWN BY: 21310
Drawn by: 213101
EXPIRATORY PAP: 6
Expiratory PAP: 6
FIO2: 40
FIO2: 50
INSPIRATORY PAP: 20
Inspiratory PAP: 20
O2 SAT: 90.3 %
O2 Saturation: 89.1 %
PCO2 ART: 67 mmHg — AB (ref 32.0–48.0)
PH ART: 7.318 — AB (ref 7.350–7.450)
PO2 ART: 67.5 mmHg — AB (ref 83.0–108.0)
Patient temperature: 37
pCO2 arterial: 77.4 mmHg (ref 32.0–48.0)
pH, Arterial: 7.304 — ABNORMAL LOW (ref 7.350–7.450)
pO2, Arterial: 62 mmHg — ABNORMAL LOW (ref 83.0–108.0)

## 2016-06-12 LAB — INFLUENZA PANEL BY PCR (TYPE A & B)
INFLAPCR: NEGATIVE
INFLBPCR: NEGATIVE

## 2016-06-12 MED ORDER — HYDRALAZINE HCL 20 MG/ML IJ SOLN: 10.0000 mg | Freq: Four times a day (QID) | INTRAMUSCULAR | Status: DC | PRN

## 2016-06-12 MED ORDER — METOLAZONE 5 MG PO TABS
2.5000 mg | ORAL_TABLET | Freq: Once | ORAL | Status: AC
  Administered 2016-06-12: 2.5 mg via ORAL
  Filled 2016-06-12: qty 1

## 2016-06-12 MED ORDER — FUROSEMIDE 10 MG/ML IJ SOLN
60.0000 mg | Freq: Two times a day (BID) | INTRAMUSCULAR | Status: DC
  Administered 2016-06-12 – 2016-06-13 (×3): 60 mg via INTRAVENOUS
  Filled 2016-06-12 (×3): qty 6

## 2016-06-12 MED ORDER — FUROSEMIDE 10 MG/ML IJ SOLN
60.0000 mg | Freq: Once | INTRAMUSCULAR | Status: AC
  Administered 2016-06-12: 60 mg via INTRAVENOUS
  Filled 2016-06-12: qty 6

## 2016-06-12 MED ORDER — LACTATED RINGERS IV SOLN: INTRAVENOUS | Status: DC

## 2016-06-12 MED ORDER — LISINOPRIL 10 MG PO TABS
10.0000 mg | ORAL_TABLET | Freq: Every day | ORAL | Status: DC
  Administered 2016-06-12 – 2016-06-17 (×6): 10 mg via ORAL
  Filled 2016-06-12 (×6): qty 1

## 2016-06-12 MED ORDER — LEVOFLOXACIN 500 MG PO TABS
500.0000 mg | ORAL_TABLET | Freq: Every day | ORAL | Status: DC
  Administered 2016-06-12 – 2016-06-13 (×2): 500 mg via ORAL
  Filled 2016-06-12 (×2): qty 1

## 2016-06-12 MED ORDER — FUROSEMIDE 10 MG/ML IJ SOLN: 60.0000 mg | Freq: Two times a day (BID) | INTRAMUSCULAR | Status: DC

## 2016-06-12 MED ORDER — IPRATROPIUM-ALBUTEROL 0.5-2.5 (3) MG/3ML IN SOLN
3.0000 mL | Freq: Three times a day (TID) | RESPIRATORY_TRACT | Status: DC
  Administered 2016-06-13: 3 mL via RESPIRATORY_TRACT
  Filled 2016-06-12: qty 3

## 2016-06-12 MED ORDER — FUROSEMIDE 10 MG/ML IJ SOLN: 40.0000 mg | Freq: Two times a day (BID) | INTRAMUSCULAR | Status: DC

## 2016-06-12 MED ORDER — METHYLPREDNISOLONE SODIUM SUCC 125 MG IJ SOLR
60.0000 mg | Freq: Every day | INTRAMUSCULAR | Status: DC
  Filled 2016-06-12: qty 2

## 2016-06-12 MED ORDER — ORAL CARE MOUTH RINSE
15.0000 mL | Freq: Two times a day (BID) | OROMUCOSAL | Status: DC
  Administered 2016-06-13 – 2016-06-16 (×3): 15 mL via OROMUCOSAL

## 2016-06-12 MED ORDER — METHYLPREDNISOLONE SODIUM SUCC 125 MG IJ SOLR: 60.0000 mg | Freq: Three times a day (TID) | INTRAMUSCULAR | Status: DC

## 2016-06-12 NOTE — Progress Notes (Signed)
Per MD request, pt removed from bipap at this time. Nebulizers given with 9lpm O2 and spo2 only 91-92%. After nebulizers were complete patient was placed on 4lpm [place] as per MD request and spo2 decreased to 87%. RT will cont to monitor patient and determine when the bipap is needed

## 2016-06-12 NOTE — Evaluation (Signed)
Physical Therapy Evaluation Patient Details Name: ABY FRIEDLEY MRN: [id number] DOB: 1945/09/18 Today's Date: 06/12/2016   History of Present Illness  71 y.o. male with medical history significant of COPD on home O2, HTN, HLD, and CHF (preserved EF, grade 1 diastolic dysfunction in 99991111) presenting because he has been SOB, very difficult to breathe.  Using the dryer, cooking makes it hard for him to breathe and he has to go outside.  It has been going on for months to years.  Has been progressively worsening, maybe over 3 weeks or so.  Acute and chronic hypoxic and hypercapnic respiratory failure - he should on 3 L nasal cannula oxygen at home due to combination of COPD and chronic diastolic CHF with EF of [id number] on recent echo.  The pt likely has acute on chronic diastolic CHF and less likely pneumonia  Clinical Impression  Pt received sitting up in the chair, wife present, and was agreeable to PT evaluation.  Pt states that he is normally independent with unlimited community ambulation, as well as independent with all ADL's, and IADL's.  He states that he normally wears 2L of O2 at home.  During PT evaluation he was independent with sit<>stand transfer, and was able to ambulate 263ft independently in regards to balance.  He did require supervision due to lines/leads, and directional cues.  During ambulation his O2 desaturated to 82% while on 9L of HFNC, but quickly improved to 90% with seated rest break.  Pt does not require any additional skilled PT at this time as his mobility is at baseline.  Therefore, PT will sign off, no follow up needs.     Follow Up Recommendations No PT follow up    Equipment Recommendations  None recommended by PT    Recommendations for Other Services       Precautions / Restrictions Precautions Precautions: None Restrictions Weight Bearing Restrictions: No      Mobility  Bed Mobility Overal bed mobility:  (Not observed, pt received sitting up in the chair. )                 Transfers Overall transfer level: Independent Equipment used: None Transfers: Sit to/from Stand Sit to Stand: Supervision;Independent (supervision with lines/leads)            Ambulation/Gait Ambulation/Gait assistance: Supervision;Independent Ambulation Distance (Feet): 200 Feet Assistive device: None Gait Pattern/deviations: WFL(Within Functional Limits)     General Gait Details: Pt desaturated to 82% on 9L HFNC, but quickly improved to 90% with seated rest break.  No LOB, or gait deficits.  Pt required increased processing time for directional cues. IE: therapist instructed him to turn left, but he turned right, and then therapist instructed him that his room was 02 and requested that he return to that room, and pt nearly walked into room 04 instead.  He then required cues to stop and turn into his room (02) as he nearly passed it.   Stairs            Wheelchair Mobility    Modified Rankin (Stroke Patients Only)       Balance Overall balance assessment: No apparent balance deficits (not formally assessed)                                           Pertinent Vitals/Pain Pain Assessment: No/denies pain    Home Living  Living Arrangements: Spouse/significant other;Children (son) Available Help at Discharge: Available PRN/intermittently Type of Home: House Home Access: Stairs to enter   [employer] of Steps: 3 steps to enter Home Layout: One level Home Equipment: Environmental consultant - 2 wheels;Cane - single point;Other (comment) (2L O2 all the time at home. )      Prior Function Level of Independence: Independent               Hand Dominance        Extremity/Trunk Assessment   Upper Extremity Assessment Upper Extremity Assessment: Overall WFL for tasks assessed    Lower Extremity Assessment Lower Extremity Assessment: Overall WFL for tasks assessed       Communication   Communication: No difficulties   Cognition Arousal/Alertness: Awake/alert Behavior During Therapy: WFL for tasks assessed/performed Overall Cognitive Status: Within Functional Limits for tasks assessed                 General Comments: However, pt does have some difficulty following directional instructions when ambulating around the hallway.  This may be due to pt being [facility].      General Comments      Exercises     Assessment/Plan    PT Assessment Patent does not need any further PT services  PT Problem List            PT Treatment Interventions      PT Goals (Current goals can be found in the Care Plan section)  Acute Rehab PT Goals PT Goal Formulation: All assessment and education complete, DC therapy    Frequency     Barriers to discharge        Co-evaluation               End of Session Equipment Utilized During Treatment: Gait belt;Oxygen Activity Tolerance: Patient limited by fatigue Patient left: in chair;with call bell/phone within reach;with family/visitor present Nurse Communication: Mobility status Evelena Peat, RN notified of pt's mobiltiy status and desaturation.  Mobility sheet left hanging in the room. )    Functional Assessment Tool Used: [employer]	[place] AM-PAC "6-clicks"  Functional Limitation: Mobility: Walking and moving around Mobility: Walking and Moving Around Current Status [phone]): 0 percent impaired, limited or restricted Mobility: Walking and Moving Around Goal Status [phone]): 0 percent impaired, limited or restricted Mobility: Walking and Moving Around Discharge Status [phone]): 0 percent impaired, limited or restricted    Time: 1412-1435 PT Time Calculation (min) (ACUTE ONLY): 23 min   Charges:   PT Evaluation $PT Eval Low Complexity: 1 Procedure PT Treatments $Gait Training: 8-22 mins   PT G Codes:   PT G-Codes **NOT FOR INPATIENT CLASS** Functional Assessment Tool Used: [employer] "6-clicks"  Functional Limitation: Mobility: Walking  and moving around Mobility: Walking and Moving Around Current Status [phone]): 0 percent impaired, limited or restricted Mobility: Walking and Moving Around Goal Status [phone]): 0 percent impaired, limited or restricted Mobility: Walking and Moving Around Discharge Status [phone]): 0 percent impaired, limited or restricted    Beth Kollyns Mickelson, PT, DPT X: [id number]

## 2016-06-12 NOTE — Progress Notes (Signed)
Flu negative results called to Dr. Candiss Norse.

## 2016-06-12 NOTE — Progress Notes (Signed)
PROGRESS NOTE                                                                                                                                                                                                             Patient Demographics:    Bradley Bates, is a 71 y.o. male, DOB - 1945-12-02, CV:[id number]  Admit date - 06/11/2016   Admitting Physician Karmen Bongo, MD  Outpatient Primary MD for the patient is [facility]  LOS - 1  Chief Complaint  Patient presents with  . Shortness of Breath       Brief Narrative  Bradley Bates is a 70 y.o. male with medical history significant of COPD on home O2, HTN, HLD, and CHF (preserved EF, grade 1 diastolic dysfunction in 99991111) presenting because he has been SOB, very difficult to breathe.  Using the dryer, cooking makes it hard for him to breathe and he has to go outside.  It has been going on for months to years.  Has been progressively worsening, maybe over 3 weeks or so.  No sick contacts.      Subjective:    Bradley Bates today has, No headache, No chest pain, No abdominal pain - No Nausea, No new weakness tingling or numbness, No Cough - Much improved SOB.   Assessment  & Plan :     1. Acute and chronic hypoxic and hypercapnic respiratory failure - he should on 3 L nasal cannula oxygen at home due to combination of COPD and chronic diastolic CHF with EF of 123456 on recent echo.  After taking detailed history and appears that patient likely has acute on chronic diastolic CHF and less likely pneumonia, he is afebrile, flu negative, he has no leukocytosis, he certainly does not have wheezing but this can be due to predominantly emphysematous COPD.  Plan is to place him on Lasix IV along with Zaroxolyn, Imdur, beta blocker, continue IV steroids, oral Levaquin, continue oxygen/BiPAP as needed, monitor intake output and daily weights, monitor clinical response  and adjust.   2. Incidental finding of 4.6 cm thoracic aortic aneurysm. Continue beta blocker, recommended outpatient cardiac thoracic follow-up (has been written in his discharge instructions as well) and PCP to monitor. Symptom-free now.  3. COPD mostly emphysematous disease with possible exacerbation. Hence he can be wheezing  free, for now IV steroids, Levaquin, nebulizer treatments, oxygen/BiPAP as needed.  4. Dyslipidemia. On statin continue.  5. Hypertension on beta blocker, reduce ACE inhibitor dose to provide room for diuretics.  6. CK D stage IV. Baseline creatinine close to 1.7. Close to baseline monitor with diuresis.     Family Communication  : None  Code Status :  Full  Diet : Diet regular Room service appropriate? Yes; Fluid consistency: Thin   Disposition Plan  :  Stepdown  Consults  :  None  Procedures  :    CTA Chest - No PE, thoracic aneurysm 4.6 cm.  DVT Prophylaxis  :  Lovenox    Lab Results  Component Value Date   PLT 166 06/12/2016    Inpatient Medications  Scheduled Meds: . arformoterol  2 mL Nebulization BID  . aspirin EC  81 mg Oral Daily  . atorvastatin  80 mg Oral q1800  . budesonide  0.5 mg Nebulization Q12H  . enoxaparin (LOVENOX) injection  40 mg Subcutaneous Q24H  . ipratropium-albuterol  3 mL Nebulization Q6H  . isosorbide mononitrate  30 mg Oral Daily  . lisinopril  40 mg Oral Daily  . methylPREDNISolone (SOLU-MEDROL) injection  60 mg Intravenous TID  . metoprolol tartrate  25 mg Oral BID   Continuous Infusions: . lactated ringers     PRN Meds:.albuterol, bisacodyl, hydrALAZINE, [DISCONTINUED] ondansetron **OR** ondansetron (ZOFRAN) IV, senna-docusate, sodium phosphate  Antibiotics  :    Anti-infectives    None         Objective:   Vitals:   06/12/16 0715 06/12/16 0933 06/12/16 0938 06/12/16 0941  BP:      Pulse: 72     Resp: 16     Temp: 98.5 F (36.9 C)     TempSrc: Axillary     SpO2: 95% 91% 92% 92%  Weight:       Height:        Wt Readings from Last 3 Encounters:  06/12/16 110.3 kg (243 lb 2.7 oz)  09/05/15 103.4 kg (228 lb)  12/21/14 103.7 kg (228 lb 9.9 oz)     Intake/Output Summary (Last 24 hours) at 06/12/16 0944 Last data filed at 06/12/16 0600  Gross per 24 hour  Intake              300 ml  Output              500 ml  Net             -200 ml     Physical Exam  Awake Alert, Oriented X 3, No new F.N deficits, Normal affect [place].AT,PERRAL Supple Neck,No JVD, No cervical lymphadenopathy appriciated.  Symmetrical Chest wall movement, Good air movement bilaterally, No wheezing- few rales RRR,No Gallops,Rubs or new Murmurs, No Parasternal Heave +ve B.Sounds, Abd Soft, No tenderness, No organomegaly appriciated, No rebound - guarding or rigidity. No Cyanosis, Clubbing or edema, No new Rash or bruise      Data Review:    CBC  Recent Labs Lab 06/11/16 1320 06/12/16 0429  WBC 7.1 7.0  HGB 14.2 14.1  HCT 46.2 46.3  PLT 173 166  MCV 87.3 86.5  MCH 26.8 26.4  MCHC 30.7 30.5  RDW 16.7* 17.7*    Chemistries   Recent Labs Lab 06/11/16 1320 06/12/16 0429  NA 142 140  K 3.8 4.6  CL 96* 98*  CO2 38* 34*  GLUCOSE 128* 104*  BUN 22* 26*  CREATININE 1.43* 1.27*  CALCIUM 8.9 8.6*  AST 20  --   ALT 13*  --   ALKPHOS 57  --   BILITOT 0.7  --    ------------------------------------------------------------------------------------------------------------------ No results for input(s): CHOL, HDL, LDLCALC, TRIG, CHOLHDL, LDLDIRECT in the last 72 hours.  No results found for: HGBA1C ------------------------------------------------------------------------------------------------------------------  Recent Labs  06/11/16 1321  TSH 0.776   ------------------------------------------------------------------------------------------------------------------ No results for input(s): VITAMINB12, FOLATE, FERRITIN, TIBC, IRON, RETICCTPCT in the last 72 hours.  Coagulation  profile No results for input(s): INR, PROTIME in the last 168 hours.   Recent Labs  06/11/16 1321  DDIMER 0.92*    Cardiac Enzymes No results for input(s): CKMB, TROPONINI, MYOGLOBIN in the last 168 hours.  Invalid input(s): CK ------------------------------------------------------------------------------------------------------------------    Component Value Date/Time   BNP 350.0 (H) 06/11/2016 1320    Micro Results Recent Results (from the past 240 hour(s))  MRSA PCR Screening     Status: None   Collection Time: 06/11/16  8:07 PM  Result Value Ref Range Status   MRSA by PCR NEGATIVE NEGATIVE Final    Comment:        The GeneXpert MRSA Assay (FDA approved for NASAL specimens only), is one component of a comprehensive MRSA colonization surveillance program. It is not intended to diagnose MRSA infection nor to guide or monitor treatment for MRSA infections.     Radiology Reports  Ct Angio Chest Pe W Or Wo Contrast  Result Date: 06/12/2016 CLINICAL DATA:  Acute onset of worsening shortness of breath. Initial encounter. EXAM: CT ANGIOGRAPHY CHEST WITH CONTRAST TECHNIQUE: Multidetector CT imaging of the chest was performed using the standard protocol during bolus administration of intravenous contrast. Multiplanar CT image reconstructions and MIPs were obtained to evaluate the vascular anatomy. CONTRAST:  100 mL of Isovue 370 IV contrast COMPARISON:  Chest radiograph performed earlier today at 1:40 p.m. FINDINGS: Cardiovascular:  There is no evidence of pulmonary embolus. There is aneurysmal dilatation of the ascending thoracic aorta to 4.6 cm in AP dimension. Mild calcification is seen along the aortic arch and descending thoracic aorta. The great vessels are grossly unremarkable in appearance. Mediastinum/Nodes: Trace pericardial fluid remains within normal limits. Visualized mediastinal nodes remain normal in size. The thyroid gland is unremarkable. No axillary  lymphadenopathy is seen. Mild bilateral gynecomastia is noted. Lungs/Pleura: Bibasilar airspace opacities raise concern for pneumonia. Mild bilateral emphysema is noted. No pleural effusion or pneumothorax is seen. No dominant mass is identified. Upper Abdomen: The visualized portions of the liver and the spleen are grossly unremarkable. The visualized portions of the right adrenal gland and right kidney are grossly unremarkable. Mild right-sided perinephric stranding is noted. Musculoskeletal: No acute osseous abnormalities are identified. The visualized musculature is unremarkable in appearance. Two small subcutaneous cystic foci are noted along the mid back at the level of T5, each measuring 1.5 cm. Review of the MIP images confirms the above findings. IMPRESSION: 1. No evidence of pulmonary embolus. 2. Bibasilar airspace opacities raise concern for pneumonia. 3. Mild bilateral emphysema noted. 4. 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 Recommend semi-annual imaging followup by CTA or MRA and referral to cardiothoracic surgery if not already obtained. This recommendation follows 2010 ACCF/AHA/AATS/ACR/ASA/SCA/SCAI/SIR/STS/SVM Guidelines for the Diagnosis and Management of Patients With Thoracic Aortic Disease. Circulation. 2010; 121: e266-e369 5. Mild bilateral gynecomastia noted. 6. Two small subcutaneous cystic foci noted along the mid back at the level of T5, each measuring 1.5 cm. These are nonspecific. Would correlate for  any associated skin findings. Electronically Signed   By: Garald Balding M.D.   On: 06/12/2016 01:20   Dg Chest Port 1 View  Result Date: 06/11/2016 CLINICAL DATA:  Shortness of breath for 3-4 years, worsened last night. EXAM: PORTABLE CHEST 1 VIEW COMPARISON:  Single-view of the chest 09/06/2015. PA and lateral chest 12/19/2012. FINDINGS: There is cardiomegaly without edema. No consolidative process, pneumothorax or effusion. IMPRESSION:  Cardiomegaly without acute disease. Electronically Signed   By: Inge Rise M.D.   On: 06/11/2016 13:53    Time Spent in minutes  30   Chinwe Lope K M.D on 06/12/2016 at 9:44 AM  Between 7am to 7pm - Pager - [phone]  After 7pm go to [email or web address] - password [facility]  [facility] -  Office  [phone]

## 2016-06-12 NOTE — Plan of Care (Signed)
Problem: Health Behavior/Discharge Planning: Goal: Ability to manage health-related needs will improve Outcome: Progressing Talked to patient about the importance of knowing about his heart failure and what to do and not to do when he goes home.

## 2016-06-13 LAB — BASIC METABOLIC PANEL
ANION GAP: 8 (ref 5–15)
BUN: 33 mg/dL — ABNORMAL HIGH (ref 6–20)
CO2: 41 mmol/L — ABNORMAL HIGH (ref 22–32)
CREATININE: 1.3 mg/dL — AB (ref 0.61–1.24)
Calcium: 8.7 mg/dL — ABNORMAL LOW (ref 8.9–10.3)
Chloride: 93 mmol/L — ABNORMAL LOW (ref 101–111)
GFR, EST NON AFRICAN AMERICAN: 55 mL/min — AB (ref >60)
Glucose, Bld: 88 mg/dL (ref 65–99)
Potassium: 4.1 mmol/L (ref 3.5–5.1)
SODIUM: 142 mmol/L (ref 135–145)

## 2016-06-13 LAB — CBC
HCT: 44.5 % (ref 39.0–52.0)
Hemoglobin: 13.6 g/dL (ref 13.0–17.0)
MCH: 27 pg (ref 26.0–34.0)
MCHC: 30.6 g/dL (ref 30.0–36.0)
MCV: 88.3 fL (ref 78.0–100.0)
Platelets: 171 10*3/uL (ref 150–400)
RBC: 5.04 MIL/uL (ref 4.22–5.81)
RDW: 17.3 % — ABNORMAL HIGH (ref 11.5–15.5)
WBC: 5.2 10*3/uL (ref 4.0–10.5)

## 2016-06-13 LAB — HEMOGLOBIN A1C
HEMOGLOBIN A1C: 6.2 % — AB (ref 4.8–5.6)
MEAN PLASMA GLUCOSE: 131 mg/dL

## 2016-06-13 LAB — MAGNESIUM: MAGNESIUM: 2 mg/dL (ref 1.7–2.4)

## 2016-06-13 MED ORDER — METHYLPREDNISOLONE SODIUM SUCC 40 MG IJ SOLR
40.0000 mg | Freq: Every day | INTRAMUSCULAR | Status: DC
  Administered 2016-06-13: 40 mg via INTRAVENOUS

## 2016-06-13 MED ORDER — ARFORMOTEROL TARTRATE 15 MCG/2ML IN NEBU
2.0000 mL | INHALATION_SOLUTION | Freq: Two times a day (BID) | RESPIRATORY_TRACT | Status: DC
  Administered 2016-06-13 – 2016-06-17 (×8): 15 ug via RESPIRATORY_TRACT
  Filled 2016-06-13 (×7): qty 2

## 2016-06-13 MED ORDER — METOLAZONE 5 MG PO TABS
5.0000 mg | ORAL_TABLET | Freq: Once | ORAL | Status: AC
  Administered 2016-06-13: 5 mg via ORAL
  Filled 2016-06-13: qty 1

## 2016-06-13 MED ORDER — IPRATROPIUM-ALBUTEROL 0.5-2.5 (3) MG/3ML IN SOLN
3.0000 mL | Freq: Two times a day (BID) | RESPIRATORY_TRACT | Status: DC
  Administered 2016-06-13 – 2016-06-14 (×2): 3 mL via RESPIRATORY_TRACT
  Filled 2016-06-13 (×2): qty 3

## 2016-06-13 MED ORDER — BUDESONIDE 0.5 MG/2ML IN SUSP
0.5000 mg | Freq: Two times a day (BID) | RESPIRATORY_TRACT | Status: DC
  Administered 2016-06-14 – 2016-06-17 (×7): 0.5 mg via RESPIRATORY_TRACT
  Filled 2016-06-13 (×7): qty 2

## 2016-06-13 NOTE — Progress Notes (Signed)
OT Cancellation Note  Patient Details Name: Bradley Bates MRN: [id number] DOB: 02-Jul-1945   Cancelled Treatment:     Reason evaluation not completed: Pt screened for OT needs. Chart reviewed, PT consulted. PTA pt independent with B/IADL completion, as well as community mobility; pt continues to be independent with functional mobility during PT evaluation. Pt is currently at baseline with ADL performance and functional transfers/mobility tasks. No further OT services required at this time.   Guadelupe Sabin, OTR/L  [phone] 06/13/2016, 11:12 AM

## 2016-06-13 NOTE — Progress Notes (Signed)
PROGRESS NOTE                                                                                                                                                                                                             Patient Demographics:    Bradley Bates, is a 71 y.o. male, DOB - 1946-04-01, CV:[id number]  Admit date - 06/11/2016   Admitting Physician Karmen Bongo, MD  Outpatient Primary MD for the patient is [facility]  LOS - 2  Chief Complaint  Patient presents with  . Shortness of Breath       Brief Narrative  Bradley Bates is a 71 y.o. male with medical history significant of COPD on home O2, HTN, HLD, and CHF (preserved EF, grade 1 diastolic dysfunction in 99991111) presenting because he has been SOB, very difficult to breathe.  Using the dryer, cooking makes it hard for him to breathe and he has to go outside.  It has been going on for months to years.  Has been progressively worsening, maybe over 3 weeks or so.  No sick contacts.    Futher workup indicated that patient likely had acute on chronic diastolic CHF rather than pneumonia.    Subjective:    [name] today has, No headache, No chest pain, No abdominal pain - No Nausea, No new weakness tingling or numbness, No Cough - Much improved SOB.   Assessment  & Plan :     1. Acute and chronic hypoxic and hypercapnic respiratory failure - baseline on 3 L nasal cannula oxygen at home due to combination of COPD and chronic diastolic CHF with EF of 123456 on recent echo.  After taking detailed history and appears that patient likely has acute on chronic diastolic CHF and less likely pneumonia, he is afebrile, flu negative, he has no leukocytosis, no cough, he certainly does not have wheezing but this can be due to predominantly emphysematous COPD. He does have ++ orthopnea on history.  He is much improved after he was diuresed on Lasix IV along  with Zaroxolyn, Imdur, beta blocker, he is now down to 4-5 L nasal cannula oxygen and daytime and when necessary BiPAP at night when he sleeps. He likely has undiagnosed sleep apnea as well. We will continue to diurese with IV diuretics another day. Monitor BMP.  Note he uses 3 L nasal cannula oxygen at home and could go home once he is close to 4-5 L on a consistent basis. Outpatient cardiology follow-up recommended post discharge.   Continue to monitor intake output and daily weights.   2. Incidental finding of 4.6 cm thoracic aortic aneurysm. Continue beta blocker, recommended outpatient cardiac thoracic follow-up (has been written in his discharge instructions as well) and PCP to monitor. Symptom-free now.  3. COPD mostly emphysematous disease with possible exacerbation. Hence he can be wheezing free, for now IV steroids, Levaquin, nebulizer treatments, oxygen/BiPAP as needed.  4. Dyslipidemia. On statin continue.  5. Hypertension on beta blocker, reduce ACE inhibitor dose to provide room for diuretics.  6. CK D stage IV. Baseline creatinine close to 1.7. Acne and actually improved with diuresis, continue to monitor with diuresis.     Family Communication  : None  Code Status :  Full  Diet : Diet Heart Room service appropriate? Yes; Fluid consistency: Thin; Fluid restriction: 1200 mL Fluid   Disposition Plan  :  Remain in Stepdown  Consults  :  None  Procedures  :    CTA Chest - No PE, thoracic aneurysm 4.6 cm.  DVT Prophylaxis  :  Lovenox    Lab Results  Component Value Date   PLT 171 06/13/2016    Inpatient Medications  Scheduled Meds: . arformoterol  2 mL Nebulization BID  . aspirin EC  81 mg Oral Daily  . atorvastatin  80 mg Oral q1800  . budesonide  0.5 mg Nebulization Q12H  . enoxaparin (LOVENOX) injection  40 mg Subcutaneous Q24H  . furosemide  60 mg Intravenous BID  . ipratropium-albuterol  3 mL Nebulization TID  . isosorbide mononitrate  30 mg Oral Daily    . levofloxacin  500 mg Oral Daily  . lisinopril  10 mg Oral Daily  . mouth rinse  15 mL Mouth Rinse BID  . methylPREDNISolone (SOLU-MEDROL) injection  60 mg Intravenous Daily  . metolazone  5 mg Oral Once  . metoprolol tartrate  25 mg Oral BID   Continuous Infusions:  PRN Meds:.albuterol, bisacodyl, hydrALAZINE, [DISCONTINUED] ondansetron **OR** ondansetron (ZOFRAN) IV, senna-docusate, sodium phosphate  Antibiotics  :    Anti-infectives    Start     Dose/Rate Route Frequency Ordered Stop   06/12/16 1015  levofloxacin (LEVAQUIN) tablet 500 mg     500 mg Oral Daily 06/12/16 1005           Objective:   Vitals:   06/13/16 0600 06/13/16 0700 06/13/16 0723 06/13/16 0820  BP: (!) 152/77 (!) 150/74    Pulse: 68 78 81   Resp: (!) 23 14 20    Temp:   97.9 F (36.6 C)   TempSrc:   Axillary   SpO2: 95% 90% 90% 92%  Weight:      Height:        Wt Readings from Last 3 Encounters:  06/13/16 108.4 kg (238 lb 15.7 oz)  09/05/15 103.4 kg (228 lb)  12/21/14 103.7 kg (228 lb 9.9 oz)     Intake/Output Summary (Last 24 hours) at 06/13/16 0901 Last data filed at 06/13/16 0500  Gross per 24 hour  Intake                0 ml  Output             2500 ml  Net            -2500 ml  Physical Exam  Awake Alert, Oriented X 3, No new F.N deficits, Normal affect [place].AT,PERRAL Supple Neck,No JVD, No cervical lymphadenopathy appriciated.  Symmetrical Chest wall movement, Good air movement bilaterally, No wheezing- few rales RRR,No Gallops,Rubs or new Murmurs, No Parasternal Heave +ve B.Sounds, Abd Soft, No tenderness, No organomegaly appriciated, No rebound - guarding or rigidity. No Cyanosis, Clubbing or edema, No new Rash or bruise      Data Review:    CBC  Recent Labs Lab 06/11/16 1320 06/12/16 0429 06/13/16 0421  WBC 7.1 7.0 5.2  HGB 14.2 14.1 13.6  HCT 46.2 46.3 44.5  PLT 173 166 171  MCV 87.3 86.5 88.3  MCH 26.8 26.4 27.0  MCHC 30.7 30.5 30.6  RDW 16.7* 17.7* 17.3*     Chemistries   Recent Labs Lab 06/11/16 1320 06/12/16 0429 06/13/16 0421  NA 142 140 142  K 3.8 4.6 4.1  CL 96* 98* 93*  CO2 38* 34* 41*  GLUCOSE 128* 104* 88  BUN 22* 26* 33*  CREATININE 1.43* 1.27* 1.30*  CALCIUM 8.9 8.6* 8.7*  MG  --   --  2.0  AST 20  --   --   ALT 13*  --   --   ALKPHOS 57  --   --   BILITOT 0.7  --   --    ------------------------------------------------------------------------------------------------------------------ No results for input(s): CHOL, HDL, LDLCALC, TRIG, CHOLHDL, LDLDIRECT in the last 72 hours.  Lab Results  Component Value Date   HGBA1C 6.2 (H) 06/11/2016   ------------------------------------------------------------------------------------------------------------------  Recent Labs  06/11/16 1321  TSH 0.776   ------------------------------------------------------------------------------------------------------------------ No results for input(s): VITAMINB12, FOLATE, FERRITIN, TIBC, IRON, RETICCTPCT in the last 72 hours.  Coagulation profile No results for input(s): INR, PROTIME in the last 168 hours.   Recent Labs  06/11/16 1321  DDIMER 0.92*    Cardiac Enzymes No results for input(s): CKMB, TROPONINI, MYOGLOBIN in the last 168 hours.  Invalid input(s): CK ------------------------------------------------------------------------------------------------------------------    Component Value Date/Time   BNP 350.0 (H) 06/11/2016 1320    Micro Results Recent Results (from the past 240 hour(s))  Respiratory Panel by PCR     Status: None   Collection Time: 06/11/16  2:41 PM  Result Value Ref Range Status   Adenovirus NOT DETECTED NOT DETECTED Final   Coronavirus 229E NOT DETECTED NOT DETECTED Final   Coronavirus HKU1 NOT DETECTED NOT DETECTED Final   Coronavirus NL63 NOT DETECTED NOT DETECTED Final   Coronavirus OC43 NOT DETECTED NOT DETECTED Final   Metapneumovirus NOT DETECTED NOT DETECTED Final   Rhinovirus /  Enterovirus NOT DETECTED NOT DETECTED Final   Influenza A NOT DETECTED NOT DETECTED Final   Influenza B NOT DETECTED NOT DETECTED Final   Parainfluenza Virus 1 NOT DETECTED NOT DETECTED Final   Parainfluenza Virus 2 NOT DETECTED NOT DETECTED Final   Parainfluenza Virus 3 NOT DETECTED NOT DETECTED Final   Parainfluenza Virus 4 NOT DETECTED NOT DETECTED Final   Respiratory Syncytial Virus NOT DETECTED NOT DETECTED Final   Bordetella pertussis NOT DETECTED NOT DETECTED Final   Chlamydophila pneumoniae NOT DETECTED NOT DETECTED Final   Mycoplasma pneumoniae NOT DETECTED NOT DETECTED Final    Comment: Performed at [facility], [facility] [address] [place], [place] [address]  MRSA PCR Screening     Status: None   Collection Time: 06/11/16  8:07 PM  Result Value Ref Range Status   MRSA by PCR NEGATIVE NEGATIVE Final    Comment:  The GeneXpert MRSA Assay (FDA approved for NASAL specimens only), is one component of a comprehensive MRSA colonization surveillance program. It is not intended to diagnose MRSA infection nor to guide or monitor treatment for MRSA infections.     Radiology Reports  Ct Angio Chest Pe W Or Wo Contrast  Result Date: 06/12/2016 CLINICAL DATA:  Acute onset of worsening shortness of breath. Initial encounter. EXAM: CT ANGIOGRAPHY CHEST WITH CONTRAST TECHNIQUE: Multidetector CT imaging of the chest was performed using the standard protocol during bolus administration of intravenous contrast. Multiplanar CT image reconstructions and MIPs were obtained to evaluate the vascular anatomy. CONTRAST:  100 mL of Isovue 370 IV contrast COMPARISON:  Chest radiograph performed earlier today at 1:40 p.m. FINDINGS: Cardiovascular:  There is no evidence of pulmonary embolus. There is aneurysmal dilatation of the ascending thoracic aorta to 4.6 cm in AP dimension. Mild calcification is seen along the aortic arch and descending thoracic aorta. The great vessels are grossly  unremarkable in appearance. Mediastinum/Nodes: Trace pericardial fluid remains within normal limits. Visualized mediastinal nodes remain normal in size. The thyroid gland is unremarkable. No axillary lymphadenopathy is seen. Mild bilateral gynecomastia is noted. Lungs/Pleura: Bibasilar airspace opacities raise concern for pneumonia. Mild bilateral emphysema is noted. No pleural effusion or pneumothorax is seen. No dominant mass is identified. Upper Abdomen: The visualized portions of the liver and the spleen are grossly unremarkable. The visualized portions of the right adrenal gland and right kidney are grossly unremarkable. Mild right-sided perinephric stranding is noted. Musculoskeletal: No acute osseous abnormalities are identified. The visualized musculature is unremarkable in appearance. Two small subcutaneous cystic foci are noted along the mid back at the level of T5, each measuring 1.5 cm. Review of the MIP images confirms the above findings. IMPRESSION: 1. No evidence of pulmonary embolus. 2. Bibasilar airspace opacities raise concern for pneumonia. 3. Mild bilateral emphysema noted. 4. 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 Recommend semi-annual imaging followup by CTA or MRA and referral to cardiothoracic surgery if not already obtained. This recommendation follows 2010 ACCF/AHA/AATS/ACR/ASA/SCA/SCAI/SIR/STS/SVM Guidelines for the Diagnosis and Management of Patients With Thoracic Aortic Disease. Circulation. 2010; 121: e266-e369 5. Mild bilateral gynecomastia noted. 6. Two small subcutaneous cystic foci noted along the mid back at the level of T5, each measuring 1.5 cm. These are nonspecific. Would correlate for any associated skin findings. Electronically Signed   By: Garald Balding M.D.   On: 06/12/2016 01:20   Dg Chest Port 1 View  Result Date: 06/11/2016 CLINICAL DATA:  Shortness of breath for 3-4 years, worsened last night. EXAM: PORTABLE CHEST 1 VIEW  COMPARISON:  Single-view of the chest 09/06/2015. PA and lateral chest 12/19/2012. FINDINGS: There is cardiomegaly without edema. No consolidative process, pneumothorax or effusion. IMPRESSION: Cardiomegaly without acute disease. Electronically Signed   By: Inge Rise M.D.   On: 06/11/2016 13:53    Time Spent in minutes  30   SINGH,PRASHANT K M.D on 06/13/2016 at 9:01 AM  Between 7am to 7pm - Pager - [phone]  After 7pm go to [email or web address] - password [facility]  [facility] -  Office  [phone]

## 2016-06-13 NOTE — Plan of Care (Signed)
Problem: Fluid Volume: Goal: Ability to maintain a balanced intake and output will improve Outcome: Progressing Talked to patient about fluid intake and output and how this relates to his heart failure.

## 2016-06-13 NOTE — Progress Notes (Signed)
**Note De-Identified [name] Obfuscation** Patient removed from BIPAP and placed on 4l HFNC; tolerating well.   RRT to continue to monitor

## 2016-06-13 NOTE — Progress Notes (Signed)
Initial Nutrition Assessment  DOCUMENTATION CODES:  Obesity unspecified  INTERVENTION:  Pt's home diet is very high in fluid, sodium, sugar. Gave impromptu education on more appropriate diet.   NUTRITION DIAGNOSIS:  Food and nutrition related knowledge deficit related to limited prior education as evidenced by Consuming large amount of sodium and fluid.  GOAL:  Patient will meet greater than or equal to 90% of their needs  MONITOR:  PO intake, Labs, Weight trends  REASON FOR ASSESSMENT:  Consult COPD Protocol  ASSESSMENT:  71 y/o Male PMHx COPD on home 02, HTN, HLD, CHF. Presented with progressively worsening SOB x 3 weeks. Worked up for Resp failure ultimately related to CHF and HTN.   Pt reports that, though her had SOB for a few weeks PTA, his PO intake never really suffered. He has had a good PO intake.   At baseline, he eats 2 meals a day. He did not follow any type of diet. RD asked diet questions to screen if home diet in lime w/ CHF diet.   Early meal ~10-11 am typically consists of porkchop or Ham/sausage/gravy biscuit. His later meal is typically fried chicken (sounds to be from [employer]). He notes he drinks a very large amount of fluids such as lemonade, soda and sweet tea. The sweet tea in particular sounds to be overly consumed. He admits to drinking this each day, all day. He drinks tea "one after the other". He likes the way it feels when he drinks the tea.   RD gave basic CHF education. Explained his high sodium intake in the form of gravy/sausage biscuits and fast food, coupled with his large fluid intake likely brought about his CHF exacerbation. RD counseled that processed meats such as sausage and gravy are extremely high in sodium. The same goes for most fast food items. RD gave recommendations of better breakfast choices and urged that he start making more meals at home. Strongly encouraged him to reduce his fluid intake; he says he wants to cut back on  his lemonade/tea intake. Emphasized importance of self weighing and how his weight could be used as a "report card" to see how well he is following the diet.   He gives a UBW as 240 lbs. He appears to have gained ~10 lbs since April. He admits he overeats.   NFPE: Obese, no wasting  Labs: A1C 2 days ago was 6.2-prediabetic Medications: Lasix, ABx, Methylprednisolone    Recent Labs Lab 06/11/16 1320 06/12/16 0429 06/13/16 0421  NA 142 140 142  K 3.8 4.6 4.1  CL 96* 98* 93*  CO2 38* 34* 41*  BUN 22* 26* 33*  CREATININE 1.43* 1.27* 1.30*  CALCIUM 8.9 8.6* 8.7*  MG  --   --  2.0  GLUCOSE 128* 104* 88    Diet Order:  Diet Heart Room service appropriate? Yes; Fluid consistency: Thin; Fluid restriction: 1200 mL Fluid  Skin:  Reviewed, no issues  Last BM:  1/30  Height:  Ht Readings from Last 1 Encounters:  06/11/16 5' 10.5" (1.791 m)   Weight:  Wt Readings from Last 1 Encounters:  06/13/16 238 lb 15.7 oz (108.4 kg)   Wt Readings from Last 10 Encounters:  06/13/16 238 lb 15.7 oz (108.4 kg)  09/05/15 228 lb (103.4 kg)  12/21/14 228 lb 9.9 oz (103.7 kg)  04/05/14 225 lb (102.1 kg)  12/28/13 225 lb (102.1 kg)  09/28/13 220 lb (99.8 kg)  08/21/13 233 lb 1.9 oz (105.7 kg)  08/17/13  220 lb (99.8 kg)  02/02/13 213 lb (96.6 kg)  12/31/12 209 lb (94.8 kg)   Ideal Body Weight:  76.82 kg  BMI:  Body mass index is 33.81 kg/m.  Estimated Nutritional Needs:  Kcal:  1950-2150 (18-20 kcal/kg bw) Protein:  92-108 g Pro (1.2-1.4g/kg ibw) Fluid:  <1.2 L- Fluid restriction  EDUCATION NEEDS:  Education needs addressed  Burtis Junes RD, LDN, CNSC Clinical Nutrition Pager: [phone] 06/13/2016 1:30 PM

## 2016-06-14 ENCOUNTER — Inpatient Hospital Stay (HOSPITAL_COMMUNITY): Payer: Medicare HMO

## 2016-06-14 DIAGNOSIS — J9602 Acute respiratory failure with hypercapnia: Secondary | ICD-10-CM

## 2016-06-14 DIAGNOSIS — E872 Acidosis: Secondary | ICD-10-CM

## 2016-06-14 LAB — BLOOD GAS, ARTERIAL
ACID-BASE EXCESS: 16.1 mmol/L — AB (ref 0.0–2.0)
Acid-Base Excess: 14.4 mmol/L — ABNORMAL HIGH (ref 0.0–2.0)
BICARBONATE: 36.8 mmol/L — AB (ref 20.0–28.0)
BICARBONATE: 35.6 mmol/L — AB (ref 20.0–28.0)
DELIVERY SYSTEMS: POSITIVE
DRAWN BY: 23534
Delivery systems: POSITIVE
Drawn by: 23534
EXPIRATORY PAP: 6
FIO2: 0.45
INSPIRATORY PAP: 20
MODE: POSITIVE
O2 CONTENT: 6 L/min
O2 CONTENT: 45 L/min
O2 Saturation: 90.3 %
O2 Saturation: 93.4 %
PH ART: 7.339 — AB (ref 7.350–7.450)
pCO2 arterial: 81.2 mmHg (ref 32.0–48.0)
pCO2 arterial: 71.1 mmHg (ref 32.0–48.0)
pH, Arterial: 7.372 (ref 7.350–7.450)
pO2, Arterial: 67.1 mmHg — ABNORMAL LOW (ref 83.0–108.0)
pO2, Arterial: 74.1 mmHg — ABNORMAL LOW (ref 83.0–108.0)

## 2016-06-14 MED ORDER — IPRATROPIUM-ALBUTEROL 0.5-2.5 (3) MG/3ML IN SOLN
3.0000 mL | Freq: Three times a day (TID) | RESPIRATORY_TRACT | Status: DC
  Administered 2016-06-15 – 2016-06-17 (×7): 3 mL via RESPIRATORY_TRACT
  Filled 2016-06-14 (×7): qty 3

## 2016-06-14 MED ORDER — METHYLPREDNISOLONE SODIUM SUCC 40 MG IJ SOLR
20.0000 mg | INTRAMUSCULAR | Status: DC
  Administered 2016-06-14: 20 mg via INTRAVENOUS
  Filled 2016-06-14: qty 1

## 2016-06-14 MED ORDER — IPRATROPIUM-ALBUTEROL 0.5-2.5 (3) MG/3ML IN SOLN
3.0000 mL | Freq: Four times a day (QID) | RESPIRATORY_TRACT | Status: DC
  Administered 2016-06-14: 3 mL via RESPIRATORY_TRACT
  Filled 2016-06-14: qty 3

## 2016-06-14 NOTE — Progress Notes (Signed)
ABG called to Dr. Cathlean Sauer. Patient off Bipap eating at this time. Will go back on bipap after food has digested.

## 2016-06-14 NOTE — Care Management Note (Signed)
Case Management Note  Patient Details  Name: Bradley Bates MRN: [id number] Date of Birth: 1945-09-22  Subjective/Objective:    Patient adm from home with Acute on chronic respiratory failure. He is ind with ADL's, has oxygen at home. She reports walking with a cane. He goes to [facility] for primary care, reports he still drives to appointments, and has no issues affording medication. No other DME or HH PTA.    Patient being placed on BiPap this morning.              Action/Plan: CM following.  Anticipate DC home with self care. Will need tank from home at time of discharge.    Expected Discharge Date:        06/16/2016          Expected Discharge Plan:  Home/Self Care  In-House Referral:  NA  Discharge planning Services  CM Consult  Post Acute Care Choice:  NA Choice offered to:  NA  DME Arranged:    DME Agency:     HH Arranged:    HH Agency:     Status of Service:  In process, will continue to follow  If discussed at Long Length of Stay Meetings, dates discussed:    Additional Comments:  Imogen Maddalena, Chauncey Reading, RN 06/14/2016, 11:29 AM

## 2016-06-14 NOTE — Progress Notes (Signed)
ABG results called to Dr. Cathlean Sauer who ordered bipap be placed on patient at this time. Patient sitting up in chair, explained to him the need for bipap, wife was at bedside as well. Patient tolerating bipap, will continue to monitor.

## 2016-06-14 NOTE — Progress Notes (Signed)
PROGRESS NOTE    Bradley Bates  [id number] DOB: 1945/11/04 DOA: 06/11/2016 PCP: [facility]   Brief Narrative:  71 yo male with COPD and chronic hypoxic respiratory failure on 2 LPM [place] at home and diastolic heart failure, presents with worsening dyspnea for the last 3 weeks before admission, worse with exertion. Found in hypoxic respiratory failure. Responding to diuresis.    Assessment & Plan:   Principal Problem:   Acute on chronic respiratory failure (HCC) Active Problems:   HTN (hypertension), malignant   COPD exacerbation (HCC)   CKD (chronic kidney disease), stage III   1. Acute on chronic hypoxic and hypercapnic respiratory failure. Chest film and CT personally reviewed noted  interstitial infiltrates and bibasilar atelectasis. Follow arterial blood gas noted worsening C02 retention, pH still above 7,3, will continue on non invasive mechanical ventilation, will increase delta pressure to 18/6 and will use intermittent during the day. Follow up chest film, personally reviewed noted decreased interstitial infiltrates but persistent bibasilar atelectasis, more left than right. Will continue bronchodilator therapy and low dose systemic steroids.   2. Cardiogenic pulmonary edema. Noted decreased infiltrates on chest film, negative fluid balance 1,835 cc over night, noted worsening renal function, will hold on furosemide for now and will continue to follow volume status. Will continue isosorbide, lisinopril and metoprolol.   3. COPD exacerbation. Will continue bronchodilator therapy with duonebs, systemic steroids and budesonide/ arformoterol. Keep negative fluid balance. CT chest negative for PE.   4. HTN. Will continue blood pressure control with lisinopril and metoprolol.   5. CKD stage 4. Renal function with worsening cr at 1.30, K at 4,1 and serum bicarbonate at 41. Will hold on furosemide for now, will follow renal panel in am, avoid hypotension or  nephrotoxic medications. Acute respiratory acidosis with chronic metabolic alkalosis.   6. Dyslipidemia. Continue on atorvastatin.   DVT prophylaxis: enoxaparin Code Status: full   Family Communication: No family at the bedside Disposition Plan: Home   Consultants:     Procedures:    Antimicrobials:      Subjective: Patient feeling better, dyspnea has improved, no cough or wheezing. No chest pain. No nausea or vomiting, yesterday has been on nasal cannula. At home on 2 LP supplemental 02 per Zeb.   Objective: Vitals:   06/14/16 0400 06/14/16 0500 06/14/16 0600 06/14/16 0748  BP: (!) 144/79 (!) 144/85 (!) 147/86   Pulse: 74 67 69 73  Resp: 19 18 17 19   Temp: 97.8 F (36.6 C)     TempSrc: Axillary     SpO2: 94% (!) 89% 90% 91%  Weight:  107.7 kg (237 lb 7 oz)    Height:        Intake/Output Summary (Last 24 hours) at 06/14/16 0750 Last data filed at 06/14/16 0500  Gross per 24 hour  Intake              240 ml  Output             2075 ml  Net            -1835 ml   Filed Weights   06/12/16 0500 06/13/16 0500 06/14/16 0500  Weight: 110.3 kg (243 lb 2.7 oz) 108.4 kg (238 lb 15.7 oz) 107.7 kg (237 lb 7 oz)    Examination:  General exam: not in dyspnea or in pain, E ENT: on full face mask bipap, no pallor or icterus, oral mucosa moist.   Respiratory system: Respiratory  effort normal. Mild decrease breath sounds at bases, no wheezing, rales or rhonchi.  Cardiovascular system: S1 & S2 heard, RRR. No JVD, murmurs, rubs, gallops or clicks. No pedal edema. Gastrointestinal system: Abdomen is protuberant, nondistended, soft and nontender. No organomegaly or masses felt. Normal bowel sounds heard. Central nervous system: Alert and oriented. No focal neurological deficits. Extremities: Symmetric 5 x 5 power. Skin: No rashes, lesions or ulcers  Data Reviewed: I have personally reviewed following labs and imaging studies  CBC:  Recent Labs Lab 06/11/16 1320  06/12/16 0429 06/13/16 0421  WBC 7.1 7.0 5.2  HGB 14.2 14.1 13.6  HCT 46.2 46.3 44.5  MCV 87.3 86.5 88.3  PLT 173 166 [id number]   Basic Metabolic Panel:  Recent Labs Lab 06/11/16 1320 06/12/16 0429 06/13/16 0421  NA 142 140 142  K 3.8 4.6 4.1  CL 96* 98* 93*  CO2 38* 34* 41*  GLUCOSE 128* 104* 88  BUN 22* 26* 33*  CREATININE 1.43* 1.27* 1.30*  CALCIUM 8.9 8.6* 8.7*  MG  --   --  2.0   GFR: Estimated Creatinine Clearance: 67.4 mL/min (by C-G formula based on SCr of 1.3 mg/dL (H)). Liver Function Tests:  Recent Labs Lab 06/11/16 1320  AST 20  ALT 13*  ALKPHOS 57  BILITOT 0.7  PROT 7.4  ALBUMIN 3.8   No results for input(s): LIPASE, AMYLASE in the last 168 hours. No results for input(s): AMMONIA in the last 168 hours. Coagulation Profile: No results for input(s): INR, PROTIME in the last 168 hours. Cardiac Enzymes: No results for input(s): CKTOTAL, CKMB, CKMBINDEX, TROPONINI in the last 168 hours. BNP (last 3 results) No results for input(s): PROBNP in the last 8760 hours. HbA1C:  Recent Labs  06/11/16 1320  HGBA1C 6.2*   CBG: No results for input(s): GLUCAP in the last 168 hours. Lipid Profile: No results for input(s): CHOL, HDL, LDLCALC, TRIG, CHOLHDL, LDLDIRECT in the last 72 hours. Thyroid Function Tests:  Recent Labs  06/11/16 1321  TSH 0.776   Anemia Panel: No results for input(s): VITAMINB12, FOLATE, FERRITIN, TIBC, IRON, RETICCTPCT in the last 72 hours. Sepsis Labs: No results for input(s): PROCALCITON, LATICACIDVEN in the last 168 hours.  Recent Results (from the past 240 hour(s))  Respiratory Panel by PCR     Status: None   Collection Time: 06/11/16  2:41 PM  Result Value Ref Range Status   Adenovirus NOT DETECTED NOT DETECTED Final   Coronavirus 229E NOT DETECTED NOT DETECTED Final   Coronavirus HKU1 NOT DETECTED NOT DETECTED Final   Coronavirus NL63 NOT DETECTED NOT DETECTED Final   Coronavirus OC43 NOT DETECTED NOT DETECTED Final    Metapneumovirus NOT DETECTED NOT DETECTED Final   Rhinovirus / Enterovirus NOT DETECTED NOT DETECTED Final   Influenza A NOT DETECTED NOT DETECTED Final   Influenza B NOT DETECTED NOT DETECTED Final   Parainfluenza Virus 1 NOT DETECTED NOT DETECTED Final   Parainfluenza Virus 2 NOT DETECTED NOT DETECTED Final   Parainfluenza Virus 3 NOT DETECTED NOT DETECTED Final   Parainfluenza Virus 4 NOT DETECTED NOT DETECTED Final   Respiratory Syncytial Virus NOT DETECTED NOT DETECTED Final   Bordetella pertussis NOT DETECTED NOT DETECTED Final   Chlamydophila pneumoniae NOT DETECTED NOT DETECTED Final   Mycoplasma pneumoniae NOT DETECTED NOT DETECTED Final    Comment: Performed at [facility], [place]. [address], [place], [place] [address]  MRSA PCR Screening     Status: None   Collection  Time: 06/11/16  8:07 PM  Result Value Ref Range Status   MRSA by PCR NEGATIVE NEGATIVE Final    Comment:        The GeneXpert MRSA Assay (FDA approved for NASAL specimens only), is one component of a comprehensive MRSA colonization surveillance program. It is not intended to diagnose MRSA infection nor to guide or monitor treatment for MRSA infections.          Radiology Studies: No results found.      Scheduled Meds: . arformoterol  2 mL Nebulization BID  . aspirin EC  81 mg Oral Daily  . atorvastatin  80 mg Oral q1800  . budesonide  0.5 mg Nebulization BID  . enoxaparin (LOVENOX) injection  40 mg Subcutaneous Q24H  . furosemide  60 mg Intravenous BID  . ipratropium-albuterol  3 mL Nebulization BID  . isosorbide mononitrate  30 mg Oral Daily  . levofloxacin  500 mg Oral Daily  . lisinopril  10 mg Oral Daily  . mouth rinse  15 mL Mouth Rinse BID  . methylPREDNISolone (SOLU-MEDROL) injection  40 mg Intravenous Daily  . metoprolol tartrate  25 mg Oral BID   Continuous Infusions:   LOS: 3 days      Mauricio Gerome Apley, MD [facility] Pager [phone]  If 7PM-7AM,  please contact night-coverage [email or web address] Password [email or web address] 06/14/2016, 7:50 AM

## 2016-06-14 NOTE — Plan of Care (Signed)
Problem: Nutrition: Goal: Adequate nutrition will be maintained Outcome: Progressing Patient understands how food high in salt can affect his heart failure and states he is going to try and make changes in his diet at home.

## 2016-06-15 ENCOUNTER — Inpatient Hospital Stay (HOSPITAL_COMMUNITY): Payer: Medicare HMO

## 2016-06-15 DIAGNOSIS — I10 Essential (primary) hypertension: Secondary | ICD-10-CM

## 2016-06-15 DIAGNOSIS — J441 Chronic obstructive pulmonary disease with (acute) exacerbation: Principal | ICD-10-CM

## 2016-06-15 DIAGNOSIS — I509 Heart failure, unspecified: Secondary | ICD-10-CM

## 2016-06-15 DIAGNOSIS — J9620 Acute and chronic respiratory failure, unspecified whether with hypoxia or hypercapnia: Secondary | ICD-10-CM

## 2016-06-15 DIAGNOSIS — N183 Chronic kidney disease, stage 3 (moderate): Secondary | ICD-10-CM

## 2016-06-15 DIAGNOSIS — J189 Pneumonia, unspecified organism: Secondary | ICD-10-CM

## 2016-06-15 LAB — BASIC METABOLIC PANEL
ANION GAP: 9 (ref 5–15)
BUN: 54 mg/dL — ABNORMAL HIGH (ref 6–20)
CHLORIDE: 92 mmol/L — AB (ref 101–111)
CO2: 38 mmol/L — AB (ref 22–32)
CREATININE: 1.54 mg/dL — AB (ref 0.61–1.24)
Calcium: 8.5 mg/dL — ABNORMAL LOW (ref 8.9–10.3)
GFR calc non Af Amer: 45 mL/min — ABNORMAL LOW (ref >60)
GFR, EST AFRICAN AMERICAN: 52 mL/min — AB (ref >60)
Glucose, Bld: 143 mg/dL — ABNORMAL HIGH (ref 65–99)
Potassium: 4.8 mmol/L (ref 3.5–5.1)
Sodium: 139 mmol/L (ref 135–145)

## 2016-06-15 LAB — ECHOCARDIOGRAM COMPLETE
HEIGHTINCHES: 70.5 in
WEIGHTICAEL: 3802.49 [oz_av]

## 2016-06-15 MED ORDER — METHYLPREDNISOLONE SODIUM SUCC 125 MG IJ SOLR
60.0000 mg | Freq: Four times a day (QID) | INTRAMUSCULAR | Status: DC
  Administered 2016-06-15 – 2016-06-17 (×9): 60 mg via INTRAVENOUS
  Filled 2016-06-15 (×9): qty 2

## 2016-06-15 MED ORDER — LEVOFLOXACIN IN D5W 500 MG/100ML IV SOLN
500.0000 mg | INTRAVENOUS | Status: DC
  Administered 2016-06-15 – 2016-06-17 (×3): 500 mg via INTRAVENOUS
  Filled 2016-06-15 (×3): qty 100

## 2016-06-15 NOTE — Progress Notes (Signed)
PROGRESS NOTE    Bradley Bates  [id number] DOB: 04-12-1946 DOA: 06/11/2016 PCP: [facility]   Brief Narrative:  71 yo male with COPD and chronic hypoxic respiratory failure on 2 LPM [place] at home and diastolic heart failure, presents with worsening dyspnea for the last 3 weeks before admission, worse with exertion. Found in hypoxic respiratory failure. Responding to diuresis.   Assessment & Plan:   Principal Problem:   Acute on chronic respiratory failure (HCC) Active Problems:   HTN (hypertension), malignant   COPD exacerbation (HCC)   CKD (chronic kidney disease), stage III   1. Acute on chronic hypoxic and hypercapnic respiratory failure. Slow improvement.  Chest films and CT personally reviewed noted interstitial infiltrates and bibasilar atelectasis concerning for pneumonia.  Started antibiotics after discussing with pulmonologist. I requested a pulmonary consultation.  Follow arterial blood gas.  Continue on non invasive mechanical ventilation. Increased dose of steroids per Dr. Luan Pulling.  Will continue bronchodilator therapy and low dose systemic steroids.   2. Cardiogenic pulmonary edema. Noted decreased infiltrates on chest film, negative fluid balance 1,835 cc over night, noted worsening renal function, will hold on furosemide for now and will continue to follow volume status. Will continue isosorbide, lisinopril and metoprolol. Check a 2D echocardiogram to assess cardiac function.  3. COPD exacerbation. Will continue bronchodilator therapy with duonebs, systemic steroids and budesonide/ arformoterol. Keep negative fluid balance. CT chest negative for PE.   4. HTN. Will continue blood pressure control with lisinopril and metoprolol.   5. CKD stage 4. Renal function with worsening cr at 1.30, K at 4,1 and serum bicarbonate at 41. Will hold on furosemide for now, will follow renal panel in am, avoid hypotension or nephrotoxic medications.   6.  Dyslipidemia. Continue on atorvastatin.   DVT prophylaxis: enoxaparin Code Status: full   Family Communication: No family at the bedside Disposition Plan: Home   Consultants:  pulmonaroy  Procedures:    Antimicrobials:   Subjective: Pt off bipap for short periods of time to eat and take meds but then back on it.    Objective: Vitals:   06/15/16 0200 06/15/16 0300 06/15/16 0400 06/15/16 0500  BP: 103/79 122/81 111/77 134/64  Pulse: 61 64 67 64  Resp: 19 (!) 21 14 (!) 22  Temp:   97.6 F (36.4 C)   TempSrc:   Axillary   SpO2: 93% 93% (!) 89% 90%  Weight:    107.8 kg (237 lb 10.5 oz)  Height:        Intake/Output Summary (Last 24 hours) at 06/15/16 0749 Last data filed at 06/15/16 0500  Gross per 24 hour  Intake              240 ml  Output             1550 ml  Net            -1310 ml   Filed Weights   06/13/16 0500 06/14/16 0500 06/15/16 0500  Weight: 108.4 kg (238 lb 15.7 oz) 107.7 kg (237 lb 7 oz) 107.8 kg (237 lb 10.5 oz)    Examination:  General exam: still with bipap but alert and oriented and follows commands E ENT: on full face mask bipap, no pallor or icterus, oral mucosa moist.   Respiratory system: Respiratory effort normal. Tight lungs bilateral, poor air movement Cardiovascular system: S1 & S2 heard. No JVD, murmurs, rubs, gallops or clicks. Gastrointestinal system: Abdomen is obese, nondistended, soft  and nontender. No organomegaly or masses felt. Normal bowel sounds heard. Central nervous system: Alert and oriented. No focal neurological deficits. Extremities: Symmetric 5 x 5 power. Skin: No rashes, lesions or ulcers  Data Reviewed: I have personally reviewed following labs and imaging studies  CBC:  Recent Labs Lab 06/11/16 1320 06/12/16 0429 06/13/16 0421  WBC 7.1 7.0 5.2  HGB 14.2 14.1 13.6  HCT 46.2 46.3 44.5  MCV 87.3 86.5 88.3  PLT 173 166 [id number]   Basic Metabolic Panel:  Recent Labs Lab 06/11/16 1320 06/12/16 0429  06/13/16 0421 06/15/16 0424  NA 142 140 142 139  K 3.8 4.6 4.1 4.8  CL 96* 98* 93* 92*  CO2 38* 34* 41* 38*  GLUCOSE 128* 104* 88 143*  BUN 22* 26* 33* 54*  CREATININE 1.43* 1.27* 1.30* 1.54*  CALCIUM 8.9 8.6* 8.7* 8.5*  MG  --   --  2.0  --    GFR: Estimated Creatinine Clearance: 56.9 mL/min (by C-G formula based on SCr of 1.54 mg/dL (H)). Liver Function Tests:  Recent Labs Lab 06/11/16 1320  AST 20  ALT 13*  ALKPHOS 57  BILITOT 0.7  PROT 7.4  ALBUMIN 3.8   No results for input(s): LIPASE, AMYLASE in the last 168 hours. No results for input(s): AMMONIA in the last 168 hours. Coagulation Profile: No results for input(s): INR, PROTIME in the last 168 hours. Cardiac Enzymes: No results for input(s): CKTOTAL, CKMB, CKMBINDEX, TROPONINI in the last 168 hours. BNP (last 3 results) No results for input(s): PROBNP in the last 8760 hours. HbA1C: No results for input(s): HGBA1C in the last 72 hours. CBG: No results for input(s): GLUCAP in the last 168 hours. Lipid Profile: No results for input(s): CHOL, HDL, LDLCALC, TRIG, CHOLHDL, LDLDIRECT in the last 72 hours. Thyroid Function Tests: No results for input(s): TSH, T4TOTAL, FREET4, T3FREE, THYROIDAB in the last 72 hours. Anemia Panel: No results for input(s): VITAMINB12, FOLATE, FERRITIN, TIBC, IRON, RETICCTPCT in the last 72 hours. Sepsis Labs: No results for input(s): PROCALCITON, LATICACIDVEN in the last 168 hours.  Recent Results (from the past 240 hour(s))  Respiratory Panel by PCR     Status: None   Collection Time: 06/11/16  2:41 PM  Result Value Ref Range Status   Adenovirus NOT DETECTED NOT DETECTED Final   Coronavirus 229E NOT DETECTED NOT DETECTED Final   Coronavirus HKU1 NOT DETECTED NOT DETECTED Final   Coronavirus NL63 NOT DETECTED NOT DETECTED Final   Coronavirus OC43 NOT DETECTED NOT DETECTED Final   Metapneumovirus NOT DETECTED NOT DETECTED Final   Rhinovirus / Enterovirus NOT DETECTED NOT DETECTED  Final   Influenza A NOT DETECTED NOT DETECTED Final   Influenza B NOT DETECTED NOT DETECTED Final   Parainfluenza Virus 1 NOT DETECTED NOT DETECTED Final   Parainfluenza Virus 2 NOT DETECTED NOT DETECTED Final   Parainfluenza Virus 3 NOT DETECTED NOT DETECTED Final   Parainfluenza Virus 4 NOT DETECTED NOT DETECTED Final   Respiratory Syncytial Virus NOT DETECTED NOT DETECTED Final   Bordetella pertussis NOT DETECTED NOT DETECTED Final   Chlamydophila pneumoniae NOT DETECTED NOT DETECTED Final   Mycoplasma pneumoniae NOT DETECTED NOT DETECTED Final    Comment: Performed at [facility], [place]. [address], [place], [place] [address]  MRSA PCR Screening     Status: None   Collection Time: 06/11/16  8:07 PM  Result Value Ref Range Status   MRSA by PCR NEGATIVE NEGATIVE Final    Comment:  The GeneXpert MRSA Assay (FDA approved for NASAL specimens only), is one component of a comprehensive MRSA colonization surveillance program. It is not intended to diagnose MRSA infection nor to guide or monitor treatment for MRSA infections.      Radiology Studies: Dg Chest 2 View  Result Date: 06/14/2016 CLINICAL DATA:  Shortness of breath, worse today, former smoking history EXAM: CHEST  2 VIEW COMPARISON:  CT chest of 06/11/2016 and chest x-ray of 06/11/2006 FINDINGS: Bibasilar opacities remain left-greater-than-right again most consistent with linear atelectasis. Early pneumonia cannot be excluded. No definite pleural effusion is seen. Mediastinal and hilar contours are unchanged and the heart is mildly enlarged and stable. No acute bony abnormality is noted. IMPRESSION: Persistent bibasilar linear opacities left-greater-than-right. Difficult to exclude early pneumonia at the left lung base and followup is recommended if clinically warranted. Electronically Signed   By: Ivar Drape M.D.   On: 06/14/2016 10:25  Scheduled Meds: . arformoterol  2 mL Nebulization BID  . aspirin EC  81 mg Oral  Daily  . atorvastatin  80 mg Oral q1800  . budesonide  0.5 mg Nebulization BID  . enoxaparin (LOVENOX) injection  40 mg Subcutaneous Q24H  . ipratropium-albuterol  3 mL Nebulization TID  . isosorbide mononitrate  30 mg Oral Daily  . levofloxacin (LEVAQUIN) IV  500 mg Intravenous Q24H  . lisinopril  10 mg Oral Daily  . mouth rinse  15 mL Mouth Rinse BID  . methylPREDNISolone (SOLU-MEDROL) injection  60 mg Intravenous Q6H  . metoprolol tartrate  25 mg Oral BID   Critical Care Time Spent: 42 mins   LOS: 4 days   Irwin Brakeman, MD [facility] Pager [phone]  If 7PM-7AM, please contact night-coverage [email or web address] Password [facility] 06/15/2016, 7:49 AM

## 2016-06-15 NOTE — Consult Note (Signed)
Consult requested by: [facility] Consult requested for respiratory failure:  HPI: This is a 71 year old who was admitted on the 29th with COPD exacerbation and respiratory failure. He says he is pretty short of breath at home with minimal exertion. He wears oxygen at home. He stopped smoking about a year ago. No other significant exposures. He does have a positive family history of COPD. He also has some element of congestive heart failure. He has been on BiPAP essentially since admission. He is still on 45% oxygen and BiPAP. No new complaints. He says his breathing is okay. He wants to take the BiPAP off. He is still saturating in the high 80s on 45% oxygen and BiPAP. He is on steroids inhaled bronchodilators and cardiac meds.  Past Medical History:  Diagnosis Date  . Bronchitis   . Congestive heart failure (CHF) ([place])   . COPD (chronic obstructive pulmonary disease) ([place])    supposed to wear home O2 but does not always wear it, 2L [place] O2  . Hypercholesteremia   . Hypertension   . Shortness of breath      Family History  Problem Relation Age of Onset  . Diabetes Brother   . Lung cancer Mother   . Cancer Brother   . Colon cancer Neg Hx      Social History   Social History  . Marital status: Married    Spouse name: N/A  . Number of children: N/A  . Years of education: N/A   Occupational History  . retired    Social History Main Topics  . Smoking status: Former Smoker    Packs/day: 0.00    Years: 35.00    Quit date: 05/24/2015  . Smokeless tobacco: Never Used  . Alcohol use No  . Drug use: No  . Sexual activity: Yes   Other Topics Concern  . None   Social History Narrative  . None     ROS: Except as mentioned above 10 point review of systems is negative    Objective: Vital signs in last 24 hours: Temp:  [97.5 F (36.4 C)-97.9 F (36.6 C)] 97.6 F (36.4 C) (02/02 0400) Pulse Rate:  [61-80] 64 (02/02 0500) Resp:  [13-22] 22 (02/02 0500) BP:  (103-148)/(64-81) 134/64 (02/02 0500) SpO2:  [89 %-95 %] 90 % (02/02 0500) FiO2 (%):  [40 %-45 %] 45 % (02/01 2012) Weight:  [107.8 kg (237 lb 10.5 oz)] 107.8 kg (237 lb 10.5 oz) (02/02 0500) Weight change: 0.1 kg (3.5 oz) Last BM Date: 06/12/16  Intake/Output from previous day: 02/01 0701 - 02/02 0700 In: 240 [P.O.:240] Out: 1550 [Urine:1550]  PHYSICAL EXAM Constitutional: He is awake and alert and on BiPAP. Oxygenation is as mentioned. Eyes: Pupils react. EOMI. Ears nose mouth and throat: Mucous membranes are moist. Cardiovascular: His heart is regular with normal heart sounds. Trace if any edema. Respiratory: His lungs show bilateral rhonchi. No wheezing now. Gastrointestinal: His abdomen is soft obese with no masses. Musculoskeletal: He is able to freely move all 4 extremities. Neurological: No focal abnormalities. Psychiatric: He's anxious skin: Warm and dry  Lab Results: Basic Metabolic Panel:  Recent Labs  06/13/16 0421 06/15/16 0424  NA 142 139  K 4.1 4.8  CL 93* 92*  CO2 41* 38*  GLUCOSE 88 143*  BUN 33* 54*  CREATININE 1.30* 1.54*  CALCIUM 8.7* 8.5*  MG 2.0  --    Liver Function Tests: No results for input(s): AST, ALT, ALKPHOS, BILITOT, PROT, ALBUMIN in the last  72 hours. No results for input(s): LIPASE, AMYLASE in the last 72 hours. No results for input(s): AMMONIA in the last 72 hours. CBC:  Recent Labs  06/13/16 0421  WBC 5.2  HGB 13.6  HCT 44.5  MCV 88.3  PLT 171   Cardiac Enzymes: No results for input(s): CKTOTAL, CKMB, CKMBINDEX, TROPONINI in the last 72 hours. BNP: No results for input(s): PROBNP in the last 72 hours. D-Dimer: No results for input(s): DDIMER in the last 72 hours. CBG: No results for input(s): GLUCAP in the last 72 hours. Hemoglobin A1C: No results for input(s): HGBA1C in the last 72 hours. Fasting Lipid Panel: No results for input(s): CHOL, HDL, LDLCALC, TRIG, CHOLHDL, LDLDIRECT in the last 72 hours. Thyroid Function  Tests: No results for input(s): TSH, T4TOTAL, FREET4, T3FREE, THYROIDAB in the last 72 hours. Anemia Panel: No results for input(s): VITAMINB12, FOLATE, FERRITIN, TIBC, IRON, RETICCTPCT in the last 72 hours. Coagulation: No results for input(s): LABPROT, INR in the last 72 hours. Urine Drug Screen: Drugs of Abuse  No results found for: LABOPIA, COCAINSCRNUR, LABBENZ, AMPHETMU, THCU, LABBARB  Alcohol Level: No results for input(s): ETH in the last 72 hours. Urinalysis: No results for input(s): COLORURINE, LABSPEC, PHURINE, GLUCOSEU, HGBUR, BILIRUBINUR, KETONESUR, PROTEINUR, UROBILINOGEN, NITRITE, LEUKOCYTESUR in the last 72 hours.  Invalid input(s): APPERANCEUR Misc. Labs:   ABGS:  Recent Labs  06/14/16 1639  PHART 7.372  PO2ART 74.1*  HCO3 35.6*     MICROBIOLOGY: Recent Results (from the past 240 hour(s))  Respiratory Panel by PCR     Status: None   Collection Time: 06/11/16  2:41 PM  Result Value Ref Range Status   Adenovirus NOT DETECTED NOT DETECTED Final   Coronavirus 229E NOT DETECTED NOT DETECTED Final   Coronavirus HKU1 NOT DETECTED NOT DETECTED Final   Coronavirus NL63 NOT DETECTED NOT DETECTED Final   Coronavirus OC43 NOT DETECTED NOT DETECTED Final   Metapneumovirus NOT DETECTED NOT DETECTED Final   Rhinovirus / Enterovirus NOT DETECTED NOT DETECTED Final   Influenza A NOT DETECTED NOT DETECTED Final   Influenza B NOT DETECTED NOT DETECTED Final   Parainfluenza Virus 1 NOT DETECTED NOT DETECTED Final   Parainfluenza Virus 2 NOT DETECTED NOT DETECTED Final   Parainfluenza Virus 3 NOT DETECTED NOT DETECTED Final   Parainfluenza Virus 4 NOT DETECTED NOT DETECTED Final   Respiratory Syncytial Virus NOT DETECTED NOT DETECTED Final   Bordetella pertussis NOT DETECTED NOT DETECTED Final   Chlamydophila pneumoniae NOT DETECTED NOT DETECTED Final   Mycoplasma pneumoniae NOT DETECTED NOT DETECTED Final    Comment: Performed at [facility], [place] [address] [place], [place] [address]  MRSA PCR Screening     Status: None   Collection Time: 06/11/16  8:07 PM  Result Value Ref Range Status   MRSA by PCR NEGATIVE NEGATIVE Final    Comment:        The GeneXpert MRSA Assay (FDA approved for NASAL specimens only), is one component of a comprehensive MRSA colonization surveillance program. It is not intended to diagnose MRSA infection nor to guide or monitor treatment for MRSA infections.     Studies/Results: Dg Chest 2 View  Result Date: 06/14/2016 CLINICAL DATA:  Shortness of breath, worse today, former smoking history EXAM: CHEST  2 VIEW COMPARISON:  CT chest of 06/11/2016 and chest x-ray of 06/11/2006 FINDINGS: Bibasilar opacities remain left-greater-than-right again most consistent with linear atelectasis. Early pneumonia cannot be excluded. No definite pleural effusion is  seen. Mediastinal and hilar contours are unchanged and the heart is mildly enlarged and stable. No acute bony abnormality is noted. IMPRESSION: Persistent bibasilar linear opacities left-greater-than-right. Difficult to exclude early pneumonia at the left lung base and followup is recommended if clinically warranted. Electronically Signed   By: Ivar Drape M.D.   On: 06/14/2016 10:25    Medications:  Prior to Admission:  Prescriptions Prior to Admission  Medication Sig Dispense Refill Last Dose  . albuterol (PROVENTIL) (2.5 MG/3ML) 0.083% nebulizer solution Take 2.5 mg by nebulization every 6 (six) hours as needed for wheezing.    unknown  . arformoterol (BROVANA) 15 MCG/2ML NEBU Take 2 mLs by nebulization 2 (two) times daily.   06/10/2016 at Unknown time  . aspirin EC 81 MG EC tablet Take 1 tablet (81 mg total) by mouth daily. 100 tablet 1 Past Week at Unknown time  . atorvastatin (LIPITOR) 40 MG tablet Take 1 tablet (40 mg total) by mouth daily. (Patient taking differently: Take 80 mg by mouth daily. ) 90 tablet 3 06/10/2016 at Unknown time  . budesonide (PULMICORT) 0.5  MG/2ML nebulizer solution Take 0.5 mg by nebulization every 12 (twelve) hours.   06/10/2016 at Unknown time  . furosemide (LASIX) 40 MG tablet TAKE ONE TABLET BY MOUTH TWICE DAILY *PLEASE CALL AND MAKE APPOINTMENT WITH DR. OFFICE FOR FURTHER REFILLS: [id number]* 60 tablet 3 06/10/2016 at Unknown time  . ipratropium (ATROVENT) 0.02 % nebulizer solution Take 0.5 mg by nebulization 3 (three) times daily.   06/10/2016 at Unknown time  . ipratropium-albuterol (DUONEB) 0.5-2.5 (3) MG/3ML SOLN Take 3 mLs by nebulization 3 (three) times daily.   06/10/2016 at Unknown time  . isosorbide mononitrate (IMDUR) 30 MG 24 hr tablet Take 1 tablet (30 mg total) by mouth daily. 30 tablet 6 06/10/2016 at Unknown time  . lisinopril (PRINIVIL,ZESTRIL) 40 MG tablet Take 1 tablet (40 mg total) by mouth daily. 90 tablet 3 06/10/2016 at Unknown time  . metoprolol tartrate (LOPRESSOR) 25 MG tablet Take 25 mg by mouth 2 (two) times daily.   06/10/2016 at Unknown time  . potassium chloride SA (K-DUR,KLOR-CON) 20 MEQ tablet Take 1/2 a tablet daily. (Patient taking differently: Take 20 mEq by mouth daily. ) 30 tablet 6 06/10/2016 at Unknown time  . spironolactone (ALDACTONE) 25 MG tablet Take 25 mg by mouth daily.   06/10/2016 at Unknown time  . albuterol (PROVENTIL HFA;VENTOLIN HFA) 108 (90 BASE) MCG/ACT inhaler Inhale 2 puffs into the lungs every 6 (six) hours as needed for shortness of breath.    unknown   Scheduled: . arformoterol  2 mL Nebulization BID  . aspirin EC  81 mg Oral Daily  . atorvastatin  80 mg Oral q1800  . budesonide  0.5 mg Nebulization BID  . enoxaparin (LOVENOX) injection  40 mg Subcutaneous Q24H  . ipratropium-albuterol  3 mL Nebulization TID  . isosorbide mononitrate  30 mg Oral Daily  . lisinopril  10 mg Oral Daily  . mouth rinse  15 mL Mouth Rinse BID  . methylPREDNISolone (SOLU-MEDROL) injection  20 mg Intravenous Q24H  . metoprolol tartrate  25 mg Oral BID   Continuous:  [id number], bisacodyl,  hydrALAZINE, [DISCONTINUED] ondansetron **OR** ondansetron (ZOFRAN) IV, senna-docusate, sodium phosphate  Assesment: He was admitted with acute on chronic hypoxic respiratory failure and COPD exacerbation. Chest x-ray yesterday that I personally reviewed shows atelectasis but potentially some infiltrates are think we should go ahead and add antibiotics. I would increase his steroids. Agree with  echocardiogram to see if there is a cardiac component of this. Principal Problem:   Acute on chronic respiratory failure (HCC) Active Problems:   HTN (hypertension), malignant   COPD exacerbation (HCC)   CKD (chronic kidney disease), stage III    Plan: As above. Thanks for allowing me to see him with you    LOS: 4 days   Aaminah Forrester L 06/15/2016, 7:33 AM

## 2016-06-15 NOTE — Progress Notes (Signed)
*  PRELIMINARY RESULTS* Echocardiogram 2D Echocardiogram has been performed.  Bradley Bates 06/15/2016, 9:34 AM

## 2016-06-16 LAB — BASIC METABOLIC PANEL
Anion gap: 8 (ref 5–15)
BUN: 58 mg/dL — AB (ref 6–20)
CHLORIDE: 91 mmol/L — AB (ref 101–111)
CO2: 40 mmol/L — AB (ref 22–32)
CREATININE: 1.57 mg/dL — AB (ref 0.61–1.24)
Calcium: 8.5 mg/dL — ABNORMAL LOW (ref 8.9–10.3)
GFR calc Af Amer: 51 mL/min — ABNORMAL LOW (ref >60)
GFR calc non Af Amer: 44 mL/min — ABNORMAL LOW (ref >60)
GLUCOSE: 177 mg/dL — AB (ref 65–99)
Potassium: 4.9 mmol/L (ref 3.5–5.1)
SODIUM: 139 mmol/L (ref 135–145)

## 2016-06-16 NOTE — Progress Notes (Signed)
PROGRESS NOTE    Bradley Bates  [id number] DOB: 11-08-1945 DOA: 06/11/2016 PCP: [facility]   Brief Narrative:  71 yo male with COPD and chronic hypoxic respiratory failure on 2 LPM  at home and diastolic heart failure, presents with worsening dyspnea for the last 3 weeks before admission, worse with exertion. Found in hypoxic respiratory failure. Responding to diuresis.   Assessment & Plan:   Principal Problem:   Acute on chronic respiratory failure (HCC) Active Problems:   HTN (hypertension), malignant   COPD exacerbation (HCC)   CKD (chronic kidney disease), stage III   1. Acute on chronic hypoxic and hypercapnic respiratory failure. Slow improvement.  Chest films and CT personally reviewed noted interstitial infiltrates and bibasilar atelectasis concerning for pneumonia.  Started antibiotics after discussing with pulmonologist.  Pt clinically much improved today and has been off bipap since yesterday and now on nasal cannula.  Continue on non invasive mechanical ventilation. Increased dose of steroids per Dr. Luan Pulling.  Will continue bronchodilator therapy and low dose systemic steroids.   2. Cardiogenic pulmonary edema. Noted decreased infiltrates on chest film, negative fluid balance (-6.2L) will hold on furosemide for now and will continue to follow volume status. Will continue isosorbide, lisinopril and metoprolol. Echocardiogram: Normal systolic function with grade 1 DD.   3. COPD exacerbation. Clinically improving.  Will continue bronchodilator therapy with duonebs, systemic steroids and budesonide/ arformoterol. Keep negative fluid balance. CT chest negative for PE.  Off bipap now.  Transfer to med surg.   4. HTN. Will continue blood pressure control with lisinopril and metoprolol.   5. CKD stage 4. Holding stable, following closely, avoid hypotension or nephrotoxic medications.   6. Dyslipidemia. Continue on atorvastatin.   DVT prophylaxis:  enoxaparin Code Status: full   Family Communication: No family at the bedside Disposition Plan: Home   Consultants:  pulmonary  Subjective: Pt has been off bipap overnight and now asking about going home.    Objective: Vitals:   06/15/16 2000 06/16/16 0000 06/16/16 0400 06/16/16 0712  BP:      Pulse: 73   68  Resp: 20   17  Temp:  98.4 F (36.9 C) 98.2 F (36.8 C) 97.9 F (36.6 C)  TempSrc:  Oral Oral Oral  SpO2: 93%   90%  Weight:   108 kg (238 lb 1.6 oz)   Height:        Intake/Output Summary (Last 24 hours) at 06/16/16 0730 Last data filed at 06/15/16 2026  Gross per 24 hour  Intake              340 ml  Output              650 ml  Net             -310 ml   Filed Weights   06/14/16 0500 06/15/16 0500 06/16/16 0400  Weight: 107.7 kg (237 lb 7 oz) 107.8 kg (237 lb 10.5 oz) 108 kg (238 lb 1.6 oz)    Examination:  General exam: off bipap, but alert and oriented and follows commands E ENT: no pallor or icterus, oral mucosa moist.   Respiratory system: Respiratory effort normal. Tight lungs bilateral but improved from prior exam.  Cardiovascular system: S1 & S2 heard. No JVD, murmurs, rubs, gallops or clicks. Gastrointestinal system: Abdomen is obese, nondistended, soft and nontender. No organomegaly or masses felt. Normal bowel sounds heard. Central nervous system: Alert and oriented. No focal neurological  deficits. Extremities: Symmetric 5 x 5 power. Skin: No rashes, lesions or ulcers  Data Reviewed: I have personally reviewed following labs and imaging studies  CBC:  Recent Labs Lab 06/11/16 1320 06/12/16 0429 06/13/16 0421  WBC 7.1 7.0 5.2  HGB 14.2 14.1 13.6  HCT 46.2 46.3 44.5  MCV 87.3 86.5 88.3  PLT 173 166 [id number]   Basic Metabolic Panel:  Recent Labs Lab 06/11/16 1320 06/12/16 0429 06/13/16 0421 06/15/16 0424 06/16/16 0501  NA 142 140 142 139 139  K 3.8 4.6 4.1 4.8 4.9  CL 96* 98* 93* 92* 91*  CO2 38* 34* 41* 38* 40*  GLUCOSE 128* 104*  88 143* 177*  BUN 22* 26* 33* 54* 58*  CREATININE 1.43* 1.27* 1.30* 1.54* 1.57*  CALCIUM 8.9 8.6* 8.7* 8.5* 8.5*  MG  --   --  2.0  --   --    GFR: Estimated Creatinine Clearance: 55.9 mL/min (by C-G formula based on SCr of 1.57 mg/dL (H)). Liver Function Tests:  Recent Labs Lab 06/11/16 1320  AST 20  ALT 13*  ALKPHOS 57  BILITOT 0.7  PROT 7.4  ALBUMIN 3.8   No results for input(s): LIPASE, AMYLASE in the last 168 hours. No results for input(s): AMMONIA in the last 168 hours. Coagulation Profile: No results for input(s): INR, PROTIME in the last 168 hours. Cardiac Enzymes: No results for input(s): CKTOTAL, CKMB, CKMBINDEX, TROPONINI in the last 168 hours. BNP (last 3 results) No results for input(s): PROBNP in the last 8760 hours. HbA1C: No results for input(s): HGBA1C in the last 72 hours. CBG: No results for input(s): GLUCAP in the last 168 hours. Lipid Profile: No results for input(s): CHOL, HDL, LDLCALC, TRIG, CHOLHDL, LDLDIRECT in the last 72 hours. Thyroid Function Tests: No results for input(s): TSH, T4TOTAL, FREET4, T3FREE, THYROIDAB in the last 72 hours. Anemia Panel: No results for input(s): VITAMINB12, FOLATE, FERRITIN, TIBC, IRON, RETICCTPCT in the last 72 hours. Sepsis Labs: No results for input(s): PROCALCITON, LATICACIDVEN in the last 168 hours.  Recent Results (from the past 240 hour(s))  Respiratory Panel by PCR     Status: None   Collection Time: 06/11/16  2:41 PM  Result Value Ref Range Status   Adenovirus NOT DETECTED NOT DETECTED Final   Coronavirus 229E NOT DETECTED NOT DETECTED Final   Coronavirus HKU1 NOT DETECTED NOT DETECTED Final   Coronavirus NL63 NOT DETECTED NOT DETECTED Final   Coronavirus OC43 NOT DETECTED NOT DETECTED Final   Metapneumovirus NOT DETECTED NOT DETECTED Final   Rhinovirus / Enterovirus NOT DETECTED NOT DETECTED Final   Influenza A NOT DETECTED NOT DETECTED Final   Influenza B NOT DETECTED NOT DETECTED Final    Parainfluenza Virus 1 NOT DETECTED NOT DETECTED Final   Parainfluenza Virus 2 NOT DETECTED NOT DETECTED Final   Parainfluenza Virus 3 NOT DETECTED NOT DETECTED Final   Parainfluenza Virus 4 NOT DETECTED NOT DETECTED Final   Respiratory Syncytial Virus NOT DETECTED NOT DETECTED Final   Bordetella pertussis NOT DETECTED NOT DETECTED Final   Chlamydophila pneumoniae NOT DETECTED NOT DETECTED Final   Mycoplasma pneumoniae NOT DETECTED NOT DETECTED Final    Comment: Performed at [facility], [place]. [address], [place],  [address]  MRSA PCR Screening     Status: None   Collection Time: 06/11/16  8:07 PM  Result Value Ref Range Status   MRSA by PCR NEGATIVE NEGATIVE Final    Comment:  The GeneXpert MRSA Assay (FDA approved for NASAL specimens only), is one component of a comprehensive MRSA colonization surveillance program. It is not intended to diagnose MRSA infection nor to guide or monitor treatment for MRSA infections.      Radiology Studies: Dg Chest 2 View  Result Date: 06/14/2016 CLINICAL DATA:  Shortness of breath, worse today, former smoking history EXAM: CHEST  2 VIEW COMPARISON:  CT chest of 06/11/2016 and chest x-ray of 06/11/2006 FINDINGS: Bibasilar opacities remain left-greater-than-right again most consistent with linear atelectasis. Early pneumonia cannot be excluded. No definite pleural effusion is seen. Mediastinal and hilar contours are unchanged and the heart is mildly enlarged and stable. No acute bony abnormality is noted. IMPRESSION: Persistent bibasilar linear opacities left-greater-than-right. Difficult to exclude early pneumonia at the left lung base and followup is recommended if clinically warranted. Electronically Signed   By: Ivar Drape M.D.   On: 06/14/2016 10:25  Scheduled Meds: . arformoterol  2 mL Nebulization BID  . aspirin EC  81 mg Oral Daily  . atorvastatin  80 mg Oral q1800  . budesonide  0.5 mg Nebulization BID  . enoxaparin  (LOVENOX) injection  40 mg Subcutaneous Q24H  . ipratropium-albuterol  3 mL Nebulization TID  . isosorbide mononitrate  30 mg Oral Daily  . levofloxacin (LEVAQUIN) IV  500 mg Intravenous Q24H  . lisinopril  10 mg Oral Daily  . mouth rinse  15 mL Mouth Rinse BID  . methylPREDNISolone (SOLU-MEDROL) injection  60 mg Intravenous Q6H  . metoprolol tartrate  25 mg Oral BID   Critical Care Time Spent: 38 mins   LOS: 5 days   Irwin Brakeman, MD [facility] Pager [phone]  If 7PM-7AM, please contact night-coverage [email or web address] Password [email or web address] 06/16/2016, 7:30 AM

## 2016-06-16 NOTE — Progress Notes (Signed)
Pt report given to [employer] on 3rd floor. Pt transferred via wheelchair to [phone]

## 2016-06-16 NOTE — Progress Notes (Signed)
Subjective: He looks better today. He has not require BiPAP overnight. He says he feels well. No other new complaints. No chest pain. He is coughing nonproductively.  Objective: Vital signs in last 24 hours: Temp:  [97.9 F (36.6 C)-98.4 F (36.9 C)] 97.9 F (36.6 C) (02/03 0712) Pulse Rate:  [63-77] 68 (02/03 0712) Resp:  [17-20] 17 (02/03 0712) BP: (127-134)/(61-71) 134/71 (02/03 0700) SpO2:  [89 %-93 %] 91 % (02/03 0836) Weight:  [108 kg (238 lb 1.6 oz)] 108 kg (238 lb 1.6 oz) (02/03 0400) Weight change: 0.2 kg (7.1 oz) Last BM Date: 06/12/16  Intake/Output from previous day: 02/02 0701 - 02/03 0700 In: 340 [P.O.:240; IV Piggyback:100] Out: 650 [Urine:650]  PHYSICAL EXAM General appearance: alert, cooperative and mild distress Resp: rhonchi bilaterally Cardio: regular rate and rhythm, S1, S2 normal, no murmur, click, rub or gallop GI: soft, non-tender; bowel sounds normal; no masses,  no organomegaly Extremities: extremities normal, atraumatic, no cyanosis or edema Skin warm and dry. Mucous membranes are moist  Lab Results:  Results for orders placed or performed during the hospital encounter of 06/11/16 (from the past 48 hour(s))  Blood gas, arterial     Status: Abnormal   Collection Time: 06/14/16 10:07 AM  Result Value Ref Range   O2 Content 6.0 L/min   Delivery systems BILEVEL POSITIVE AIRWAY PRESSURE    pH, Arterial 7.339 (L) 7.350 - 7.450   pCO2 arterial 81.2 (HH) 32.0 - 48.0 mmHg    Comment: CRITICAL RESULT CALLED TO, READ BACK BY AND VERIFIED WITH: JESSIE MOTLEY,RN ON 06/14/2016 BY PEVIANY LAWSON,RRT AT 1017.    pO2, Arterial 67.1 (L) 83.0 - 108.0 mmHg   Bicarbonate 36.8 (H) 20.0 - 28.0 mmol/L   Acid-Base Excess 16.1 (H) 0.0 - 2.0 mmol/L   O2 Saturation 90.3 %   Collection site RIGHT RADIAL    Drawn by [phone]    Sample type ARTERIAL    Allens test (pass/fail) PASS PASS  Blood gas, arterial     Status: Abnormal   Collection Time: 06/14/16  4:39 PM  Result  Value Ref Range   FIO2 0.45    O2 Content 45.0 L/min   Delivery systems BILEVEL POSITIVE AIRWAY PRESSURE    Mode BILEVEL POSITIVE AIRWAY PRESSURE    Inspiratory PAP 20    Expiratory PAP 6    pH, Arterial 7.372 7.350 - 7.450   pCO2 arterial 71.1 (HH) 32.0 - 48.0 mmHg    Comment: CRITICAL RESULT CALLED TO, READ BACK BY AND VERIFIED WITH: JESSIE MOTLEY,RN ON 06/14/2016 BY PEVIANY LAWSON,RRT AT 6045.    pO2, Arterial 74.1 (L) 83.0 - 108.0 mmHg   Bicarbonate 35.6 (H) 20.0 - 28.0 mmol/L   Acid-Base Excess 14.4 (H) 0.0 - 2.0 mmol/L   O2 Saturation 93.4 %   Collection site LEFT RADIAL    Drawn by [phone]    Sample type ARTERIAL    Allens test (pass/fail) PASS PASS  Basic metabolic panel     Status: Abnormal   Collection Time: 06/15/16  4:24 AM  Result Value Ref Range   Sodium 139 135 - 145 mmol/L   Potassium 4.8 3.5 - 5.1 mmol/L   Chloride 92 (L) 101 - 111 mmol/L   CO2 38 (H) 22 - 32 mmol/L   Glucose, Bld 143 (H) 65 - 99 mg/dL   BUN 54 (H) 6 - 20 mg/dL   Creatinine, Ser 1.54 (H) 0.61 - 1.24 mg/dL   Calcium 8.5 (L) 8.9 - 10.3 mg/dL  GFR calc non Af Amer 45 (L) >60 mL/min   GFR calc Af Amer 52 (L) >60 mL/min    Comment: (NOTE) The eGFR has been calculated using the CKD EPI equation. This calculation has not been validated in all clinical situations. eGFR's persistently <60 mL/min signify possible Chronic Kidney Disease.    Anion gap 9 5 - 15  Basic metabolic panel     Status: Abnormal   Collection Time: 06/16/16  5:01 AM  Result Value Ref Range   Sodium 139 135 - 145 mmol/L   Potassium 4.9 3.5 - 5.1 mmol/L   Chloride 91 (L) 101 - 111 mmol/L   CO2 40 (H) 22 - 32 mmol/L   Glucose, Bld 177 (H) 65 - 99 mg/dL   BUN 58 (H) 6 - 20 mg/dL   Creatinine, Ser 1.57 (H) 0.61 - 1.24 mg/dL   Calcium 8.5 (L) 8.9 - 10.3 mg/dL   GFR calc non Af Amer 44 (L) >60 mL/min   GFR calc Af Amer 51 (L) >60 mL/min    Comment: (NOTE) The eGFR has been calculated using the CKD EPI equation. This  calculation has not been validated in all clinical situations. eGFR's persistently <60 mL/min signify possible Chronic Kidney Disease.    Anion gap 8 5 - 15    ABGS  Recent Labs  06/14/16 1639  PHART 7.372  PO2ART 74.1*  HCO3 35.6*   CULTURES Recent Results (from the past 240 hour(s))  Respiratory Panel by PCR     Status: None   Collection Time: 06/11/16  2:41 PM  Result Value Ref Range Status   Adenovirus NOT DETECTED NOT DETECTED Final   Coronavirus 229E NOT DETECTED NOT DETECTED Final   Coronavirus HKU1 NOT DETECTED NOT DETECTED Final   Coronavirus NL63 NOT DETECTED NOT DETECTED Final   Coronavirus OC43 NOT DETECTED NOT DETECTED Final   Metapneumovirus NOT DETECTED NOT DETECTED Final   Rhinovirus / Enterovirus NOT DETECTED NOT DETECTED Final   Influenza A NOT DETECTED NOT DETECTED Final   Influenza B NOT DETECTED NOT DETECTED Final   Parainfluenza Virus 1 NOT DETECTED NOT DETECTED Final   Parainfluenza Virus 2 NOT DETECTED NOT DETECTED Final   Parainfluenza Virus 3 NOT DETECTED NOT DETECTED Final   Parainfluenza Virus 4 NOT DETECTED NOT DETECTED Final   Respiratory Syncytial Virus NOT DETECTED NOT DETECTED Final   Bordetella pertussis NOT DETECTED NOT DETECTED Final   Chlamydophila pneumoniae NOT DETECTED NOT DETECTED Final   Mycoplasma pneumoniae NOT DETECTED NOT DETECTED Final    Comment: Performed at [facility], [place] [address], [place], [place] [address]  MRSA PCR Screening     Status: None   Collection Time: 06/11/16  8:07 PM  Result Value Ref Range Status   MRSA by PCR NEGATIVE NEGATIVE Final    Comment:        The GeneXpert MRSA Assay (FDA approved for NASAL specimens only), is one component of a comprehensive MRSA colonization surveillance program. It is not intended to diagnose MRSA infection nor to guide or monitor treatment for MRSA infections.    Studies/Results: Dg Chest 2 View  Result Date: 06/14/2016 CLINICAL DATA:  Shortness of  breath, worse today, former smoking history EXAM: CHEST  2 VIEW COMPARISON:  CT chest of 06/11/2016 and chest x-ray of 06/11/2006 FINDINGS: Bibasilar opacities remain left-greater-than-right again most consistent with linear atelectasis. Early pneumonia cannot be excluded. No definite pleural effusion is seen. Mediastinal and hilar contours are unchanged and the  heart is mildly enlarged and stable. No acute bony abnormality is noted. IMPRESSION: Persistent bibasilar linear opacities left-greater-than-right. Difficult to exclude early pneumonia at the left lung base and followup is recommended if clinically warranted. Electronically Signed   By: Paul  Barry M.D.   On: 06/14/2016 10:25    Medications:  Prior to Admission:  Prescriptions Prior to Admission  Medication Sig Dispense Refill Last Dose  . albuterol (PROVENTIL) (2.5 MG/3ML) 0.083% nebulizer solution Take 2.5 mg by nebulization every 6 (six) hours as needed for wheezing.    unknown  . arformoterol (BROVANA) 15 MCG/2ML NEBU Take 2 mLs by nebulization 2 (two) times daily.   06/10/2016 at Unknown time  . aspirin EC 81 MG EC tablet Take 1 tablet (81 mg total) by mouth daily. 100 tablet 1 Past Week at Unknown time  . atorvastatin (LIPITOR) 40 MG tablet Take 1 tablet (40 mg total) by mouth daily. (Patient taking differently: Take 80 mg by mouth daily. ) 90 tablet 3 06/10/2016 at Unknown time  . budesonide (PULMICORT) 0.5 MG/2ML nebulizer solution Take 0.5 mg by nebulization every 12 (twelve) hours.   06/10/2016 at Unknown time  . furosemide (LASIX) 40 MG tablet TAKE ONE TABLET BY MOUTH TWICE DAILY *PLEASE CALL AND MAKE APPOINTMENT WITH DR. OFFICE FOR FURTHER REFILLS: [phone]* 60 tablet 3 06/10/2016 at Unknown time  . ipratropium (ATROVENT) 0.02 % nebulizer solution Take 0.5 mg by nebulization 3 (three) times daily.   06/10/2016 at Unknown time  . ipratropium-albuterol (DUONEB) 0.5-2.5 (3) MG/3ML SOLN Take 3 mLs by nebulization 3 (three) times daily.    06/10/2016 at Unknown time  . isosorbide mononitrate (IMDUR) 30 MG 24 hr tablet Take 1 tablet (30 mg total) by mouth daily. 30 tablet 6 06/10/2016 at Unknown time  . lisinopril (PRINIVIL,ZESTRIL) 40 MG tablet Take 1 tablet (40 mg total) by mouth daily. 90 tablet 3 06/10/2016 at Unknown time  . metoprolol tartrate (LOPRESSOR) 25 MG tablet Take 25 mg by mouth 2 (two) times daily.   06/10/2016 at Unknown time  . potassium chloride SA (K-DUR,KLOR-CON) 20 MEQ tablet Take 1/2 a tablet daily. (Patient taking differently: Take 20 mEq by mouth daily. ) 30 tablet 6 06/10/2016 at Unknown time  . spironolactone (ALDACTONE) 25 MG tablet Take 25 mg by mouth daily.   06/10/2016 at Unknown time  . albuterol (PROVENTIL HFA;VENTOLIN HFA) 108 (90 BASE) MCG/ACT inhaler Inhale 2 puffs into the lungs every 6 (six) hours as needed for shortness of breath.    unknown   Scheduled: . arformoterol  2 mL Nebulization BID  . aspirin EC  81 mg Oral Daily  . atorvastatin  80 mg Oral q1800  . budesonide  0.5 mg Nebulization BID  . enoxaparin (LOVENOX) injection  40 mg Subcutaneous Q24H  . ipratropium-albuterol  3 mL Nebulization TID  . isosorbide mononitrate  30 mg Oral Daily  . levofloxacin (LEVAQUIN) IV  500 mg Intravenous Q24H  . lisinopril  10 mg Oral Daily  . mouth rinse  15 mL Mouth Rinse BID  . methylPREDNISolone (SOLU-MEDROL) injection  60 mg Intravenous Q6H  . metoprolol tartrate  25 mg Oral BID   Continuous:  PRN:albuterol, bisacodyl, hydrALAZINE, [DISCONTINUED] ondansetron **OR** ondansetron (ZOFRAN) IV, senna-docusate, sodium phosphate  Assesment: He has acute on chronic respiratory failure with COPD exacerbation. He is significantly better this morning. He had been requiring BiPAP but did not need it last night. Principal Problem:   Acute on chronic respiratory failure (HCC) Active Problems:     HTN (hypertension), malignant   COPD exacerbation (HCC)   CKD (chronic kidney disease), stage III    Plan:  Continue current treatments    LOS: 5 days   Sanaz Scarlett L 06/16/2016, 10:02 AM

## 2016-06-17 DIAGNOSIS — I714 Abdominal aortic aneurysm, without rupture, unspecified: Secondary | ICD-10-CM | POA: Diagnosis present

## 2016-06-17 LAB — BASIC METABOLIC PANEL
Anion gap: 9 (ref 5–15)
BUN: 65 mg/dL — ABNORMAL HIGH (ref 6–20)
CO2: 37 mmol/L — AB (ref 22–32)
CREATININE: 1.55 mg/dL — AB (ref 0.61–1.24)
Calcium: 8.3 mg/dL — ABNORMAL LOW (ref 8.9–10.3)
Chloride: 93 mmol/L — ABNORMAL LOW (ref 101–111)
GFR calc non Af Amer: 44 mL/min — ABNORMAL LOW (ref >60)
GFR, EST AFRICAN AMERICAN: 51 mL/min — AB (ref >60)
Glucose, Bld: 162 mg/dL — ABNORMAL HIGH (ref 65–99)
POTASSIUM: 4.6 mmol/L (ref 3.5–5.1)
Sodium: 139 mmol/L (ref 135–145)

## 2016-06-17 MED ORDER — LEVOFLOXACIN 500 MG PO TABS: 500.0000 mg | ORAL_TABLET | Freq: Every day | ORAL | Status: DC

## 2016-06-17 MED ORDER — ALBUTEROL SULFATE (2.5 MG/3ML) 0.083% IN NEBU: 2.5000 mg | INHALATION_SOLUTION | Freq: Four times a day (QID) | RESPIRATORY_TRACT | 0 refills | Status: AC | PRN

## 2016-06-17 MED ORDER — ARFORMOTEROL TARTRATE 15 MCG/2ML IN NEBU: 2.0000 mL | INHALATION_SOLUTION | Freq: Two times a day (BID) | RESPIRATORY_TRACT | 0 refills | Status: AC

## 2016-06-17 MED ORDER — ALBUTEROL SULFATE HFA 108 (90 BASE) MCG/ACT IN AERS: 2.0000 | INHALATION_SPRAY | Freq: Four times a day (QID) | RESPIRATORY_TRACT | 1 refills | Status: AC | PRN

## 2016-06-17 MED ORDER — LEVOFLOXACIN 500 MG PO TABS: 500.0000 mg | ORAL_TABLET | Freq: Every day | ORAL | 0 refills | Status: AC

## 2016-06-17 MED ORDER — IPRATROPIUM-ALBUTEROL 0.5-2.5 (3) MG/3ML IN SOLN: 3.0000 mL | Freq: Three times a day (TID) | RESPIRATORY_TRACT | 0 refills | Status: AC

## 2016-06-17 MED ORDER — BUDESONIDE 0.5 MG/2ML IN SUSP: 0.5000 mg | Freq: Two times a day (BID) | RESPIRATORY_TRACT | 0 refills | Status: AC

## 2016-06-17 MED ORDER — FUROSEMIDE 40 MG PO TABS: 40.0000 mg | ORAL_TABLET | Freq: Every day | ORAL | 3 refills | Status: AC

## 2016-06-17 MED ORDER — PREDNISONE 20 MG PO TABS: ORAL_TABLET | ORAL | 0 refills | Status: DC

## 2016-06-17 NOTE — Discharge Instructions (Signed)
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  Return if symptoms return, worsen or new problems develop.

## 2016-06-17 NOTE — Progress Notes (Signed)
Pt given discharge instructions and information on making follow-up appointments, prescriptions and when to call a doctor or return to hospital. Patient to be discharged on home O2 with his wife. Wife is at bedside, they deny and concerns or needs at this time. PIV taken out, no complications.

## 2016-06-17 NOTE — Discharge Summary (Signed)
Physician Discharge Summary  Bradley Bates [id number] DOB: [birth year] DOA: 06/11/2016  PCP: [facility]  Admit date: 06/11/2016 Discharge date: 06/17/2016  Admitted From: Home  Disposition:  Home   Recommendations for Outpatient Follow-up:  1. Follow up with PCP in 1 weeks 2. Follow up with pulmonologist in 1 week 3. Establish care with cardiothoracic surgeon in 2 weeks to evaluate aneurysm: Recommend semi-annual imaging followup by CTA or MRA   Discharge Condition: STABLE CODE STATUS: FULL  Diet recommendation: Heart Healthy   Brief/Interim Summary: HPI: Bradley Bates is a 71 y.o. male with medical history significant of COPD on home O2, HTN, HLD, and CHF (preserved EF, grade 1 diastolic dysfunction in [id number]) presenting because he has been SOB, very difficult to breathe.  Using the dryer, cooking makes it hard for him to breathe and he has to go outside.  It has been going on for months to years.  Has been progressively worsening, maybe over 3 weeks or so.  No sick contacts.  No fever.  No cough.  No LE edema.  ED Course: Per Dr. Jeanell Sparrow: 1- dyspnea- copd exacerbation- patient given solumedrol, albuterol, on bipap after abg on oxygen with ph 7.29/78/56. Flu sent  2- hypertension With history of hypertensive urgency. BP down to 172 from 202 without intervention. Nicardipine ordered but held.  3- creatinine at 1.43- stable from first prior.  Plan admission for ongoing therapy and evaluation  1. Acute on chronic hypoxic and hypercapnic respiratory failure. Community acquired pneumonia Chest films and CT personally reviewed noted interstitial infiltrates and bibasilar atelectasis concerning for pneumonia.  Started antibiotics levofloxacin after discussing with pulmonologist.  Pt clinically much improved and has been off bipap and was on high flow nasal cannula that is being weaned.  When I went in to see patient this morning the oxygen was on the floor and he did not have  any on and he was totally asymptomatic and did not know how long the oxygen had been off.  He is on chronic supplemental oxygen at home and will have him continue this.  He insists that he be discharged today.  He has been ambulating and not having any problems.  He is not willing to stay in the hospital any longer.  I spoke with him and his wife (by telephone) and I explained risks and they verbalized understanding.  They were advised to return or go to ER for any acute worsening or new problems.  They verbalized understanding. Pt says that he will take his meds and follow up as recommended.  Says he needed paper prescriptions as his local pharmacy is closed today.  Will continue bronchodilator therapy, antibiotics and steroid taper at discharge. Close follow up recommended. .   2. Cardiogenic pulmonary edema. Noted decreased infiltrates on chest film, negative fluid balance (-6.2L), resume home furosemide at discharge. Will continue isosorbide, lisinopril and metoprolol. Echocardiogram: Normal systolic function with grade 1 DD.   3. COPD exacerbation. Clinically improving.  Will continue bronchodilator therapy with duonebs, systemic steroids and budesonide/ arformoterol. Keep negative fluid balance. CT chest negative for PE.  Off bipap now.     4. HTN. Will continue blood pressure control with lisinopril and metoprolol.   5. CKD stage 4. Holding stable, following closely, avoid hypotension or nephrotoxic medications.   6. Dyslipidemia. Continue on atorvastatin.   7.  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 Recommend semi-annual imaging followup by CTA  or MRA and referral to cardiothoracic surgery if not already obtained.  Pt given information to follow up with a cardiothoracic surgeon.   DVT prophylaxis: enoxaparin Code Status: full   Family Communication: wife Disposition Plan: Home   Consultants:  pulmonary  Discharge Diagnoses:  Principal  Problem:   Acute on chronic respiratory failure ([place]) Active Problems:   HTN (hypertension), malignant   COPD exacerbation ([facility])   CKD (chronic kidney disease), stage III  Discharge Instructions  Discharge Instructions    Increase activity slowly    Complete by:  As directed      Allergies as of 06/17/2016   No Known Allergies     Medication List    STOP taking these medications   ipratropium 0.02 % nebulizer solution Commonly known as:  ATROVENT     TAKE these medications   albuterol 108 (90 Base) MCG/ACT inhaler Commonly known as:  PROVENTIL HFA;VENTOLIN HFA Inhale 2 puffs into the lungs every 6 (six) hours as needed for shortness of breath.   albuterol (2.5 MG/3ML) 0.083% nebulizer solution Commonly known as:  PROVENTIL Take 3 mLs (2.5 mg total) by nebulization every 6 (six) hours as needed for wheezing.   arformoterol 15 MCG/2ML Nebu Commonly known as:  BROVANA Take 2 mLs (15 mcg total) by nebulization 2 (two) times daily.   aspirin 81 MG EC tablet Take 1 tablet (81 mg total) by mouth daily.   atorvastatin 40 MG tablet Commonly known as:  LIPITOR Take 1 tablet (40 mg total) by mouth daily. What changed:  how much to take   budesonide 0.5 MG/2ML nebulizer solution Commonly known as:  PULMICORT Take 2 mLs (0.5 mg total) by nebulization every 12 (twelve) hours.   furosemide 40 MG tablet Commonly known as:  LASIX Take 1 tablet (40 mg total) by mouth daily. What changed:  See the new instructions.   ipratropium-albuterol 0.5-2.5 (3) MG/3ML Soln Commonly known as:  DUONEB Take 3 mLs by nebulization 3 (three) times daily.   isosorbide mononitrate 30 MG 24 hr tablet Commonly known as:  IMDUR Take 1 tablet (30 mg total) by mouth daily.   levofloxacin 500 MG tablet Commonly known as:  LEVAQUIN Take 1 tablet (500 mg total) by mouth daily. Start taking on:  06/18/2016   lisinopril 40 MG tablet Commonly known as:  PRINIVIL,ZESTRIL Take 1 tablet (40 mg total)  by mouth daily.   metoprolol tartrate 25 MG tablet Commonly known as:  LOPRESSOR Take 25 mg by mouth 2 (two) times daily.   potassium chloride SA 20 MEQ tablet Commonly known as:  K-DUR,KLOR-CON Take 1/2 a tablet daily. What changed:  how much to take  how to take this  when to take this  additional instructions   predniSONE 20 MG tablet Commonly known as:  DELTASONE Take 3 tabs po daily for 5 days, then 2 tabs po daily for 5 days, then 1 tab po daily for 5 days then stop   spironolactone 25 MG tablet Commonly known as:  ALDACTONE Take 25 mg by mouth daily.      [facility]. Schedule an appointment as soon as possible for a visit in 1 week(s).   Contact information: [address] [place] [address] [phone]        Melrose Nakayama, MD. Schedule an appointment as soon as possible for a visit in 2 week(s).   Specialty:  Cardiothoracic Surgery Why:  Aneurysm Contact information: [address] [place] [address] [phone]        HAWKINS,EDWARD L, MD. Schedule an appointment as soon as possible for a visit in 1 week(s).   Specialty:  Pulmonary Disease Contact information: [place] [address] [phone]          No Known Allergies  Procedures/Studies: Dg Chest 2 View  Result Date: 06/14/2016 CLINICAL DATA:  Shortness of breath, worse today, former smoking history EXAM: CHEST  2 VIEW COMPARISON:  CT chest of 06/11/2016 and chest x-ray of 06/11/2006 FINDINGS: Bibasilar opacities remain left-greater-than-right again most consistent with linear atelectasis. Early pneumonia cannot be excluded. No definite pleural effusion is seen. Mediastinal and hilar contours are unchanged and the heart is mildly enlarged and stable. No acute bony abnormality is noted. IMPRESSION: Persistent bibasilar linear opacities left-greater-than-right. Difficult to exclude  early pneumonia at the left lung base and followup is recommended if clinically warranted. Electronically Signed   By: Ivar Drape M.D.   On: 06/14/2016 10:25   Ct Angio Chest Pe W Or Wo Contrast  Result Date: 06/12/2016 CLINICAL DATA:  Acute onset of worsening shortness of breath. Initial encounter. EXAM: CT ANGIOGRAPHY CHEST WITH CONTRAST TECHNIQUE: Multidetector CT imaging of the chest was performed using the standard protocol during bolus administration of intravenous contrast. Multiplanar CT image reconstructions and MIPs were obtained to evaluate the vascular anatomy. CONTRAST:  100 mL of Isovue 370 IV contrast COMPARISON:  Chest radiograph performed earlier today at 1:40 p.m. FINDINGS: Cardiovascular:  There is no evidence of pulmonary embolus. There is aneurysmal dilatation of the ascending thoracic aorta to 4.6 cm in AP dimension. Mild calcification is seen along the aortic arch and descending thoracic aorta. The great vessels are grossly unremarkable in appearance. Mediastinum/Nodes: Trace pericardial fluid remains within normal limits. Visualized mediastinal nodes remain normal in size. The thyroid gland is unremarkable. No axillary lymphadenopathy is seen. Mild bilateral gynecomastia is noted. Lungs/Pleura: Bibasilar airspace opacities raise concern for pneumonia. Mild bilateral emphysema is noted. No pleural effusion or pneumothorax is seen. No dominant mass is identified. Upper Abdomen: The visualized portions of the liver and the spleen are grossly unremarkable. The visualized portions of the right adrenal gland and right kidney are grossly unremarkable. Mild right-sided perinephric stranding is noted. Musculoskeletal: No acute osseous abnormalities are identified. The visualized musculature is unremarkable in appearance. Two small subcutaneous cystic foci are noted along the mid back at the level of T5, each measuring 1.5 cm. Review of the MIP images confirms the above findings. IMPRESSION: 1. No  evidence of pulmonary embolus. 2. Bibasilar airspace opacities raise concern for pneumonia. 3. Mild bilateral emphysema noted. 4. 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 Recommend semi-annual imaging followup by CTA or MRA and referral to cardiothoracic surgery if not already obtained. This recommendation follows 2010 ACCF/AHA/AATS/ACR/ASA/SCA/SCAI/SIR/STS/SVM Guidelines for the Diagnosis and Management of Patients With Thoracic Aortic Disease. Circulation. 2010; 121: e266-e369 5. Mild bilateral gynecomastia noted. 6. Two small subcutaneous cystic foci noted along the mid back at the level of T5, each measuring 1.5 cm. These are nonspecific. Would correlate for any associated skin findings. Electronically Signed   By: Garald Balding M.D.   On: 06/12/2016 01:20   Dg Chest Port 1 View  Result Date: 06/11/2016 CLINICAL DATA:  Shortness of breath for 3-4 years, worsened last night. EXAM: PORTABLE CHEST 1 VIEW COMPARISON:  Single-view of the chest 09/06/2015. PA and lateral chest  12/19/2012. FINDINGS: There is cardiomegaly without edema. No consolidative process, pneumothorax or effusion. IMPRESSION: Cardiomegaly without acute disease. Electronically Signed   By: Inge Rise M.D.   On: 06/11/2016 13:53     Subjective: Pt says he feels back to his baseline and wants to go home, not willing to stay longer  Discharge Exam: Vitals:   06/16/16 2229 06/17/16 0639  BP: (!) 163/71 (!) 148/71  Pulse: 89 66  Resp: 18 18  Temp: 98.9 F (37.2 C) 97.7 F (36.5 C)   Vitals:   06/16/16 2141 06/16/16 2229 06/17/16 0639 06/17/16 0803  BP:  (!) 163/71 (!) 148/71   Pulse: 85 89 66   Resp: 18 18 18    Temp:  98.9 F (37.2 C) 97.7 F (36.5 C)   TempSrc:  Oral Oral   SpO2: (!) 88% 91%  96%  Weight:      Height:        General exam:  alert and oriented and follows commands ambulating in room without difficulty E ENT: no pallor or icterus, oral mucosa moist.    Respiratory system: Respiratory effort normal. Better air movement bilateral from prior exam.  Cardiovascular system: S1 & S2 heard. No JVD, murmurs, rubs, gallops or clicks. Gastrointestinal system: Abdomen is obese, nondistended, soft and nontender. No organomegaly or masses felt. Normal bowel sounds heard. Central nervous system: Alert and oriented. No focal neurological deficits. Extremities: Symmetric 5 x 5 power. Skin: No rashes, lesions or ulcers  The results of significant diagnostics from this hospitalization (including imaging, microbiology, ancillary and laboratory) are listed below for reference.     Microbiology: Recent Results (from the past 240 hour(s))  Respiratory Panel by PCR     Status: None   Collection Time: 06/11/16  2:41 PM  Result Value Ref Range Status   Adenovirus NOT DETECTED NOT DETECTED Final   Coronavirus 229E NOT DETECTED NOT DETECTED Final   Coronavirus HKU1 NOT DETECTED NOT DETECTED Final   Coronavirus NL63 NOT DETECTED NOT DETECTED Final   Coronavirus OC43 NOT DETECTED NOT DETECTED Final   Metapneumovirus NOT DETECTED NOT DETECTED Final   Rhinovirus / Enterovirus NOT DETECTED NOT DETECTED Final   Influenza A NOT DETECTED NOT DETECTED Final   Influenza B NOT DETECTED NOT DETECTED Final   Parainfluenza Virus 1 NOT DETECTED NOT DETECTED Final   Parainfluenza Virus 2 NOT DETECTED NOT DETECTED Final   Parainfluenza Virus 3 NOT DETECTED NOT DETECTED Final   Parainfluenza Virus 4 NOT DETECTED NOT DETECTED Final   Respiratory Syncytial Virus NOT DETECTED NOT DETECTED Final   Bordetella pertussis NOT DETECTED NOT DETECTED Final   Chlamydophila pneumoniae NOT DETECTED NOT DETECTED Final   Mycoplasma pneumoniae NOT DETECTED NOT DETECTED Final    Comment: Performed at [facility], [place]. [address] [place], [place] [address]  MRSA PCR Screening     Status: None   Collection Time: 06/11/16  8:07 PM  Result Value Ref Range Status   MRSA by PCR NEGATIVE  NEGATIVE Final    Comment:        The GeneXpert MRSA Assay (FDA approved for NASAL specimens only), is one component of a comprehensive MRSA colonization surveillance program. It is not intended to diagnose MRSA infection nor to guide or monitor treatment for MRSA infections.     Labs: BNP (last 3 results)  Recent Labs  09/05/15 2108 06/11/16 1320  BNP 731.0* [id number]*   Basic Metabolic Panel:  Recent Labs Lab 06/12/16 0429 06/13/16 0421  06/15/16 0424 06/16/16 0501 06/17/16 0619  NA 140 142 139 139 139  K 4.6 4.1 4.8 4.9 4.6  CL 98* 93* 92* 91* 93*  CO2 34* 41* 38* 40* 37*  GLUCOSE 104* 88 143* 177* 162*  BUN 26* 33* 54* 58* 65*  CREATININE 1.27* 1.30* 1.54* 1.57* 1.55*  CALCIUM 8.6* 8.7* 8.5* 8.5* 8.3*  MG  --  2.0  --   --   --    Liver Function Tests:  Recent Labs Lab 06/11/16 1320  AST 20  ALT 13*  ALKPHOS 57  BILITOT 0.7  PROT 7.4  ALBUMIN 3.8   No results for input(s): LIPASE, AMYLASE in the last 168 hours. No results for input(s): AMMONIA in the last 168 hours. CBC:  Recent Labs Lab 06/11/16 1320 06/12/16 0429 06/13/16 0421  WBC 7.1 7.0 5.2  HGB 14.2 14.1 13.6  HCT 46.2 46.3 44.5  MCV 87.3 86.5 88.3  PLT 173 166 171   Cardiac Enzymes: No results for input(s): CKTOTAL, CKMB, CKMBINDEX, TROPONINI in the last 168 hours. BNP: Invalid input(s): POCBNP CBG: No results for input(s): GLUCAP in the last 168 hours. D-Dimer No results for input(s): DDIMER in the last 72 hours. Hgb A1c No results for input(s): HGBA1C in the last 72 hours. Lipid Profile No results for input(s): CHOL, HDL, LDLCALC, TRIG, CHOLHDL, LDLDIRECT in the last 72 hours. Thyroid function studies No results for input(s): TSH, T4TOTAL, T3FREE, THYROIDAB in the last 72 hours.  Invalid input(s): FREET3 Anemia work up No results for input(s): VITAMINB12, FOLATE, FERRITIN, TIBC, IRON, RETICCTPCT in the last 72 hours. Urinalysis    Component Value Date/Time   COLORURINE  RED (A) 09/06/2015 0032   APPEARANCEUR HAZY (A) 09/06/2015 0032   LABSPEC 1.020 09/06/2015 0032   PHURINE 5.0 09/06/2015 0032   GLUCOSEU NEGATIVE 09/06/2015 0032   HGBUR LARGE (A) 09/06/2015 0032   BILIRUBINUR NEGATIVE 09/06/2015 0032   KETONESUR NEGATIVE 09/06/2015 0032   PROTEINUR TRACE (A) 09/06/2015 0032   UROBILINOGEN 0.2 12/15/2012 1051   NITRITE NEGATIVE 09/06/2015 0032   LEUKOCYTESUR TRACE (A) 09/06/2015 0032   Sepsis Labs Invalid input(s): PROCALCITONIN,  WBC,  LACTICIDVEN Microbiology Recent Results (from the past 240 hour(s))  Respiratory Panel by PCR     Status: None   Collection Time: 06/11/16  2:41 PM  Result Value Ref Range Status   Adenovirus NOT DETECTED NOT DETECTED Final   Coronavirus 229E NOT DETECTED NOT DETECTED Final   Coronavirus HKU1 NOT DETECTED NOT DETECTED Final   Coronavirus NL63 NOT DETECTED NOT DETECTED Final   Coronavirus OC43 NOT DETECTED NOT DETECTED Final   Metapneumovirus NOT DETECTED NOT DETECTED Final   Rhinovirus / Enterovirus NOT DETECTED NOT DETECTED Final   Influenza A NOT DETECTED NOT DETECTED Final   Influenza B NOT DETECTED NOT DETECTED Final   Parainfluenza Virus 1 NOT DETECTED NOT DETECTED Final   Parainfluenza Virus 2 NOT DETECTED NOT DETECTED Final   Parainfluenza Virus 3 NOT DETECTED NOT DETECTED Final   Parainfluenza Virus 4 NOT DETECTED NOT DETECTED Final   Respiratory Syncytial Virus NOT DETECTED NOT DETECTED Final   Bordetella pertussis NOT DETECTED NOT DETECTED Final   Chlamydophila pneumoniae NOT DETECTED NOT DETECTED Final   Mycoplasma pneumoniae NOT DETECTED NOT DETECTED Final    Comment: Performed at [facility], [place] [address] [place], [place] [address]  MRSA PCR Screening     Status: None   Collection Time: 06/11/16  8:07 PM  Result Value Ref  Range Status   MRSA by PCR NEGATIVE NEGATIVE Final    Comment:        The GeneXpert MRSA Assay (FDA approved for NASAL specimens only), is one component of  a comprehensive MRSA colonization surveillance program. It is not intended to diagnose MRSA infection nor to guide or monitor treatment for MRSA infections.    Time coordinating discharge: 33 minutes  SIGNED:  Irwin Brakeman, MD  [facility] 06/17/2016, 11:49 AM Pager   If 7PM-7AM, please contact night-coverage [email or web address] Password [email or web address]

## 2016-06-17 NOTE — Progress Notes (Signed)
PHARMACIST - PHYSICIAN COMMUNICATION DR:   Wynetta Emery CONCERNING: Antibiotic IV to Oral Route Change Policy  RECOMMENDATION: This patient is receiving levaquin by the intravenous route.  Based on criteria approved by the Pharmacy and Therapeutics Committee, the antibiotic(s) is/are being converted to the equivalent oral dose form(s).   DESCRIPTION: These criteria include:  Patient being treated for a respiratory tract infection, urinary tract infection, cellulitis or clostridium difficile associated diarrhea if on metronidazole  The patient is not neutropenic and does not exhibit a GI malabsorption state  The patient is eating (either orally or via tube) and/or has been taking other orally administered medications for a least 24 hours  The patient is improving clinically and has a Tmax < 100.5  If you have questions about this conversion, please contact the Pharmacy Department  [x]   [phone] )  [facility] []   [phone] )  [facility] []   [phone] )  [facility] []   [phone] )  [facility] []   [phone] )  [facility]

## 2016-06-17 NOTE — Progress Notes (Signed)
Subjective: He says he feels better. He has moved out of stepdown. No new complaints. He is having tapering down of his oxygen replacement. No chest pain. He is still short of breath. No abdominal pain nausea or vomiting.  Objective: Vital signs in last 24 hours: Temp:  [97.7 F (36.5 C)-98.9 F (37.2 C)] 97.7 F (36.5 C) (02/04 0639) Pulse Rate:  [66-89] 66 (02/04 0639) Resp:  [18] 18 (02/04 0639) BP: (119-163)/(57-71) 148/71 (02/04 0639) SpO2:  [88 %-96 %] 96 % (02/04 0803) Weight change:  Last BM Date: 06/11/16  Intake/Output from previous day: 02/03 0701 - 02/04 0700 In: 29 [P.O.:720; IV Piggyback:100] Out: 1402 [Urine:1400; Stool:2]  PHYSICAL EXAM General appearance: alert, cooperative and mild distress Resp: rhonchi bilaterally Cardio: regular rate and rhythm, S1, S2 normal, no murmur, click, rub or gallop GI: soft, non-tender; bowel sounds normal; no masses,  no organomegaly Extremities: extremities normal, atraumatic, no cyanosis or edema Skin warm and dry. Mucous membranes are moist  Lab Results:  Results for orders placed or performed during the hospital encounter of 06/11/16 (from the past 48 hour(s))  Basic metabolic panel     Status: Abnormal   Collection Time: 06/16/16  5:01 AM  Result Value Ref Range   Sodium 139 135 - 145 mmol/L   Potassium 4.9 3.5 - 5.1 mmol/L   Chloride 91 (L) 101 - 111 mmol/L   CO2 40 (H) 22 - 32 mmol/L   Glucose, Bld 177 (H) 65 - 99 mg/dL   BUN 58 (H) 6 - 20 mg/dL   Creatinine, Ser 1.57 (H) 0.61 - 1.24 mg/dL   Calcium 8.5 (L) 8.9 - 10.3 mg/dL   GFR calc non Af Amer 44 (L) >60 mL/min   GFR calc Af Amer 51 (L) >60 mL/min    Comment: (NOTE) The eGFR has been calculated using the CKD EPI equation. This calculation has not been validated in all clinical situations. eGFR's persistently <60 mL/min signify possible Chronic Kidney Disease.    Anion gap 8 5 - 15  Basic metabolic panel     Status: Abnormal   Collection Time: 06/17/16   6:19 AM  Result Value Ref Range   Sodium 139 135 - 145 mmol/L   Potassium 4.6 3.5 - 5.1 mmol/L   Chloride 93 (L) 101 - 111 mmol/L   CO2 37 (H) 22 - 32 mmol/L   Glucose, Bld 162 (H) 65 - 99 mg/dL   BUN 65 (H) 6 - 20 mg/dL   Creatinine, Ser 1.55 (H) 0.61 - 1.24 mg/dL   Calcium 8.3 (L) 8.9 - 10.3 mg/dL   GFR calc non Af Amer 44 (L) >60 mL/min   GFR calc Af Amer 51 (L) >60 mL/min    Comment: (NOTE) The eGFR has been calculated using the CKD EPI equation. This calculation has not been validated in all clinical situations. eGFR's persistently <60 mL/min signify possible Chronic Kidney Disease.    Anion gap 9 5 - 15    ABGS  Recent Labs  06/14/16 1639  PHART 7.372  PO2ART 74.1*  HCO3 35.6*   CULTURES Recent Results (from the past 240 hour(s))  Respiratory Panel by PCR     Status: None   Collection Time: 06/11/16  2:41 PM  Result Value Ref Range Status   Adenovirus NOT DETECTED NOT DETECTED Final   Coronavirus 229E NOT DETECTED NOT DETECTED Final   Coronavirus HKU1 NOT DETECTED NOT DETECTED Final   Coronavirus NL63 NOT DETECTED NOT DETECTED Final  Coronavirus OC43 NOT DETECTED NOT DETECTED Final   Metapneumovirus NOT DETECTED NOT DETECTED Final   Rhinovirus / Enterovirus NOT DETECTED NOT DETECTED Final   Influenza A NOT DETECTED NOT DETECTED Final   Influenza B NOT DETECTED NOT DETECTED Final   Parainfluenza Virus 1 NOT DETECTED NOT DETECTED Final   Parainfluenza Virus 2 NOT DETECTED NOT DETECTED Final   Parainfluenza Virus 3 NOT DETECTED NOT DETECTED Final   Parainfluenza Virus 4 NOT DETECTED NOT DETECTED Final   Respiratory Syncytial Virus NOT DETECTED NOT DETECTED Final   Bordetella pertussis NOT DETECTED NOT DETECTED Final   Chlamydophila pneumoniae NOT DETECTED NOT DETECTED Final   Mycoplasma pneumoniae NOT DETECTED NOT DETECTED Final    Comment: Performed at [facility], [place] [address], [place], [place] [address]  MRSA PCR Screening     Status: None    Collection Time: 06/11/16  8:07 PM  Result Value Ref Range Status   MRSA by PCR NEGATIVE NEGATIVE Final    Comment:        The GeneXpert MRSA Assay (FDA approved for NASAL specimens only), is one component of a comprehensive MRSA colonization surveillance program. It is not intended to diagnose MRSA infection nor to guide or monitor treatment for MRSA infections.    Studies/Results: No results found.  Medications:  Prior to Admission:  Prescriptions Prior to Admission  Medication Sig Dispense Refill Last Dose  . albuterol (PROVENTIL) (2.5 MG/3ML) 0.083% nebulizer solution Take 2.5 mg by nebulization every 6 (six) hours as needed for wheezing.    unknown  . arformoterol (BROVANA) 15 MCG/2ML NEBU Take 2 mLs by nebulization 2 (two) times daily.   06/10/2016 at Unknown time  . aspirin EC 81 MG EC tablet Take 1 tablet (81 mg total) by mouth daily. 100 tablet 1 Past Week at Unknown time  . atorvastatin (LIPITOR) 40 MG tablet Take 1 tablet (40 mg total) by mouth daily. (Patient taking differently: Take 80 mg by mouth daily. ) 90 tablet 3 06/10/2016 at Unknown time  . budesonide (PULMICORT) 0.5 MG/2ML nebulizer solution Take 0.5 mg by nebulization every 12 (twelve) hours.   06/10/2016 at Unknown time  . furosemide (LASIX) 40 MG tablet TAKE ONE TABLET BY MOUTH TWICE DAILY *PLEASE CALL AND MAKE APPOINTMENT WITH DR. OFFICE FOR FURTHER REFILLS: [phone]* 60 tablet 3 06/10/2016 at Unknown time  . ipratropium (ATROVENT) 0.02 % nebulizer solution Take 0.5 mg by nebulization 3 (three) times daily.   06/10/2016 at Unknown time  . ipratropium-albuterol (DUONEB) 0.5-2.5 (3) MG/3ML SOLN Take 3 mLs by nebulization 3 (three) times daily.   06/10/2016 at Unknown time  . isosorbide mononitrate (IMDUR) 30 MG 24 hr tablet Take 1 tablet (30 mg total) by mouth daily. 30 tablet 6 06/10/2016 at Unknown time  . lisinopril (PRINIVIL,ZESTRIL) 40 MG tablet Take 1 tablet (40 mg total) by mouth daily. 90 tablet 3 06/10/2016 at  Unknown time  . metoprolol tartrate (LOPRESSOR) 25 MG tablet Take 25 mg by mouth 2 (two) times daily.   06/10/2016 at Unknown time  . potassium chloride SA (K-DUR,KLOR-CON) 20 MEQ tablet Take 1/2 a tablet daily. (Patient taking differently: Take 20 mEq by mouth daily. ) 30 tablet 6 06/10/2016 at Unknown time  . spironolactone (ALDACTONE) 25 MG tablet Take 25 mg by mouth daily.   06/10/2016 at Unknown time  . albuterol (PROVENTIL HFA;VENTOLIN HFA) 108 (90 BASE) MCG/ACT inhaler Inhale 2 puffs into the lungs every 6 (six) hours as needed for shortness of  breath.    unknown   Scheduled: . arformoterol  2 mL Nebulization BID  . aspirin EC  81 mg Oral Daily  . atorvastatin  80 mg Oral q1800  . budesonide  0.5 mg Nebulization BID  . enoxaparin (LOVENOX) injection  40 mg Subcutaneous Q24H  . ipratropium-albuterol  3 mL Nebulization TID  . isosorbide mononitrate  30 mg Oral Daily  . levofloxacin (LEVAQUIN) IV  500 mg Intravenous Q24H  . lisinopril  10 mg Oral Daily  . mouth rinse  15 mL Mouth Rinse BID  . methylPREDNISolone (SOLU-MEDROL) injection  60 mg Intravenous Q6H  . metoprolol tartrate  25 mg Oral BID   Continuous:  VEL:[id number], bisacodyl, hydrALAZINE, [DISCONTINUED] ondansetron **OR** ondansetron (ZOFRAN) IV, senna-docusate, sodium phosphate  Assesment: He was admitted with acute on chronic respiratory failure. He initially required BiPAP but he's doing okay without that now. He is still on high flow nasal oxygen. He has been diuresing. He is known to have heart failure. Principal Problem:   Acute on chronic respiratory failure (HCC) Active Problems:   HTN (hypertension), malignant   COPD exacerbation (HCC)   CKD (chronic kidney disease), stage III    Plan: Continue current treatments. Probably can taper steroids. Continue to monitor oxygen saturation.    LOS: 6 days   Limmie Schoenberg L 06/17/2016, 10:19 AM

## 2016-06-18 ENCOUNTER — Encounter: Payer: Self-pay | Admitting: Family Medicine

## 2016-07-17 ENCOUNTER — Encounter: Payer: Self-pay | Admitting: Thoracic Surgery (Cardiothoracic Vascular Surgery)

## 2016-07-17 ENCOUNTER — Institutional Professional Consult (permissible substitution) (INDEPENDENT_AMBULATORY_CARE_PROVIDER_SITE_OTHER): Payer: Medicare HMO | Admitting: Thoracic Surgery (Cardiothoracic Vascular Surgery)

## 2016-07-17 VITALS — BP 157/79 | HR 80 | Resp 20 | Ht 70.0 in | Wt 239.0 lb

## 2016-07-17 DIAGNOSIS — I712 Thoracic aortic aneurysm, without rupture: Secondary | ICD-10-CM | POA: Diagnosis not present

## 2016-07-17 DIAGNOSIS — I7121 Aneurysm of the ascending aorta, without rupture: Secondary | ICD-10-CM

## 2016-07-17 NOTE — Progress Notes (Signed)
[address]       [address],[place] [address]             [phone]      PCP is [facility] Referring Provider is [facility]  Chief Complaint  Patient presents with  . Thoracic Aortic Aneurysm    Surgical eval, CTA Chest 06/12/16, ascending thoracic aorta to 4.6 cm    HPI: [name] is a 71 year old man who sent for consultation regarding an ascending aneurysm.  [name] is a 71 year old man with a history of hypertension, hyperlipidemia, tobacco abuse (quit one-year ago), COPD with bronchitis, congestive heart failure, and obesity. He recently was admitted to the hospital with shortness of breath and diagnosed with pneumonia. A CT angiogram was done as part of his workup and showed a 4.6 cm ascending aneurysm.  He denies any chest pain, pressure, or tightness. He does have shortness of breath with exertion. He's been on home oxygen for "a couple of years", currently using 3 L nasal cannula.    Past Medical History:  Diagnosis Date  . Bronchitis   . Congestive heart failure (CHF) ([place])   . COPD (chronic obstructive pulmonary disease) ([place])    supposed to wear home O2 but does not always wear it, 2L [place] O2  . Hypercholesteremia   . Hypertension   . Shortness of breath     Past Surgical History:  Procedure Laterality Date  . COLONOSCOPY N/A 08/17/2013   Procedure: COLONOSCOPY;  Surgeon: Danie Binder, MD;  Location: [facility];  Service: Endoscopy;  Laterality: N/A;  8:30 AM  . NO PAST SURGERIES      Family History  Problem Relation Age of Onset  . Diabetes Brother   . Lung cancer Mother   . Cancer Brother   . Colon cancer Neg Hx     Social History Social History  Substance Use Topics  . Smoking status: Former Smoker    Packs/day: 0.00    Years: 35.00    Quit date: 05/24/2015  . Smokeless tobacco: Never Used  . Alcohol use No    Current Outpatient Prescriptions  Medication Sig Dispense Refill  .  albuterol (PROVENTIL) (2.5 MG/3ML) 0.083% nebulizer solution Take 3 mLs (2.5 mg total) by nebulization every 6 (six) hours as needed for wheezing. 75 mL 0  . arformoterol (BROVANA) 15 MCG/2ML NEBU Take 2 mLs (15 mcg total) by nebulization 2 (two) times daily. 120 mL 0  . aspirin EC 81 MG EC tablet Take 1 tablet (81 mg total) by mouth daily. 100 tablet 1  . atorvastatin (LIPITOR) 40 MG tablet Take 1 tablet (40 mg total) by mouth daily. (Patient taking differently: Take 80 mg by mouth daily. ) 90 tablet 3  . budesonide (PULMICORT) 0.5 MG/2ML nebulizer solution Take 2 mLs (0.5 mg total) by nebulization every 12 (twelve) hours. 120 mL 0  . furosemide (LASIX) 40 MG tablet Take 1 tablet (40 mg total) by mouth daily. 60 tablet 3  . ipratropium-albuterol (DUONEB) 0.5-2.5 (3) MG/3ML SOLN Take 3 mLs by nebulization 3 (three) times daily. 360 mL 0  . isosorbide mononitrate (IMDUR) 30 MG 24 hr tablet Take 1 tablet (30 mg total) by mouth daily. 30 tablet 6  . lisinopril (PRINIVIL,ZESTRIL) 40 MG tablet Take 1 tablet (40 mg total) by mouth daily. 90 tablet 3  . metoprolol tartrate (LOPRESSOR) 25 MG tablet Take 25 mg by mouth 2 (two) times daily.    Marland Kitchen  potassium chloride SA (K-DUR,KLOR-CON) 20 MEQ tablet Take 1/2 a tablet daily. (Patient taking differently: Take 20 mEq by mouth daily. ) 30 tablet 6  . spironolactone (ALDACTONE) 25 MG tablet Take 25 mg by mouth daily.    Marland Kitchen albuterol (PROVENTIL HFA;VENTOLIN HFA) 108 (90 Base) MCG/ACT inhaler Inhale 2 puffs into the lungs every 6 (six) hours as needed for shortness of breath. (Patient not taking: Reported on 07/17/2016) 1 Inhaler 1   No current facility-administered medications for this visit.     No Known Allergies  Review of Systems  Constitutional: Positive for activity change and fatigue. Negative for appetite change.  HENT: Positive for dental problem (Poor dentition). Negative for trouble swallowing and voice change.   Respiratory: Positive for cough,  shortness of breath and wheezing.        3 L O2  Cardiovascular: Positive for leg swelling. Negative for chest pain.       Heart failure  Gastrointestinal: Negative for abdominal pain and blood in stool.  Genitourinary: Negative for dysuria and flank pain.  Musculoskeletal: Positive for arthralgias.  Neurological: Negative for syncope and weakness.  Hematological: Negative for adenopathy. Does not bruise/bleed easily.    BP (!) 157/79   Pulse 80   Resp 20   Ht 5\' 10"  (1.778 m)   Wt 239 lb (108.4 kg)   SpO2 90% Comment: 3L O2 per [place]  BMI 34.29 kg/m  Physical Exam  Constitutional: He is oriented to person, place, and time. He appears well-developed and well-nourished. No distress.  HENT:  Head: Normocephalic and atraumatic.  Mouth/Throat: No oropharyngeal exudate.  Oxygen cannula in place  Eyes: Conjunctivae and EOM are normal. No scleral icterus.  Neck: Neck supple. No thyromegaly present.  Cardiovascular: Normal rate, regular rhythm and normal heart sounds.   No murmur heard. Pulmonary/Chest: Effort normal. No respiratory distress. He has wheezes. He has no rales.  Abdominal: Soft. He exhibits no distension. There is no tenderness.  Musculoskeletal: He exhibits edema (1+).  Lymphadenopathy:    He has no cervical adenopathy.  Neurological: He is alert and oriented to person, place, and time. No cranial nerve deficit.  Skin: Skin is warm and dry.     Diagnostic Tests: CT ANGIOGRAPHY CHEST WITH CONTRAST  TECHNIQUE: Multidetector CT imaging of the chest was performed using the standard protocol during bolus administration of intravenous contrast. Multiplanar CT image reconstructions and MIPs were obtained to evaluate the vascular anatomy.  CONTRAST:  100 mL of Isovue 370 IV contrast  COMPARISON:  Chest radiograph performed earlier today at 1:40 p.m.  FINDINGS: Cardiovascular:  There is no evidence of pulmonary embolus.  There is aneurysmal dilatation of the  ascending thoracic aorta to 4.6 cm in AP dimension. Mild calcification is seen along the aortic arch and descending thoracic aorta. The great vessels are grossly unremarkable in appearance.  Mediastinum/Nodes: Trace pericardial fluid remains within normal limits. Visualized mediastinal nodes remain normal in size. The thyroid gland is unremarkable. No axillary lymphadenopathy is seen.  Mild bilateral gynecomastia is noted.  Lungs/Pleura: Bibasilar airspace opacities raise concern for pneumonia. Mild bilateral emphysema is noted. No pleural effusion or pneumothorax is seen. No dominant mass is identified.  Upper Abdomen: The visualized portions of the liver and the spleen are grossly unremarkable. The visualized portions of the right adrenal gland and right kidney are grossly unremarkable. Mild right-sided perinephric stranding is noted.  Musculoskeletal: No acute osseous abnormalities are identified. The visualized musculature is unremarkable in appearance.  Two small subcutaneous  cystic foci are noted along the mid back at the level of T5, each measuring 1.5 cm.  Review of the MIP images confirms the above findings.  IMPRESSION: 1. No evidence of pulmonary embolus. 2. Bibasilar airspace opacities raise concern for pneumonia. 3. Mild bilateral emphysema noted. 4. 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 Recommend semi-annual imaging followup by CTA or MRA and referral to cardiothoracic surgery if not already obtained. This recommendation follows 2010 ACCF/AHA/AATS/ACR/ASA/SCA/SCAI/SIR/STS/SVM Guidelines for the Diagnosis and Management of Patients With Thoracic Aortic Disease. Circulation. 2010; 121: e266-e369 5. Mild bilateral gynecomastia noted. 6. Two small subcutaneous cystic foci noted along the mid back at the level of T5, each measuring 1.5 cm. These are nonspecific. Would correlate for any associated skin  findings.   Electronically Signed   By: Garald Balding M.D.   On: 06/12/2016 01:20 I personally reviewed the CT chest and concur with the findings noted.  Impression:  Mr. Sunderlin is a 71 year old man with multiple medical issues including morbid obesity, hypertension, hyperlipidemia, atherosclerotic cardiovascular disease, congestive heart failure, COPD with bronchitis, and stage III chronic kidney disease. He was incidentally noted to have a 4.6 cm thoracic aortic aneurysm on a CT angiogram done to rule out pulmonary embolus.  Thoracic aortic aneurysm- There is no indication for surgery at this time. He would be a poor candidate for surgery given his significant pulmonary limitations, but I do think we should follow the aneurysm for now. Blood pressure control is the mainstay of treatment. We will plan to do an MR angiogram in 6 months.  Thoracic aortic atherosclerotic disease- on Lipitor  Hypertension- blood pressure elevated today. Current regimen is Lasix, Imdur, lisinopril, metoprolol and Aldactone. Needs to follow up with his primary at [facility] within the next 2 weeks.  Morbid obesity- weight loss encouraged  Smoking cessation instruction/counseling given:  commended patient for quitting and reviewed strategies for preventing relapses   Hyperlipidemia- on Lipitor  COPD- significant wheezing on exam despite telling me that he did take his inhalers and nebulizers today.  Plan:  Return in 6 months with MR angiogram chest   Melrose Nakayama, MD [facility] [phone]

## 2016-08-12 DEATH — deceased

## 2018-10-14 IMAGING — CR DG CHEST 1V PORT
1 series · 1 of 1 positions shown · non-contrast
Comparison: Single-view of the chest 09/06/2015. PA and lateral
chest 12/19/2012.

CLINICAL DATA: Shortness of breath for 3-4 years, worsened last
night.

EXAM:
PORTABLE CHEST 1 VIEW

[portable]
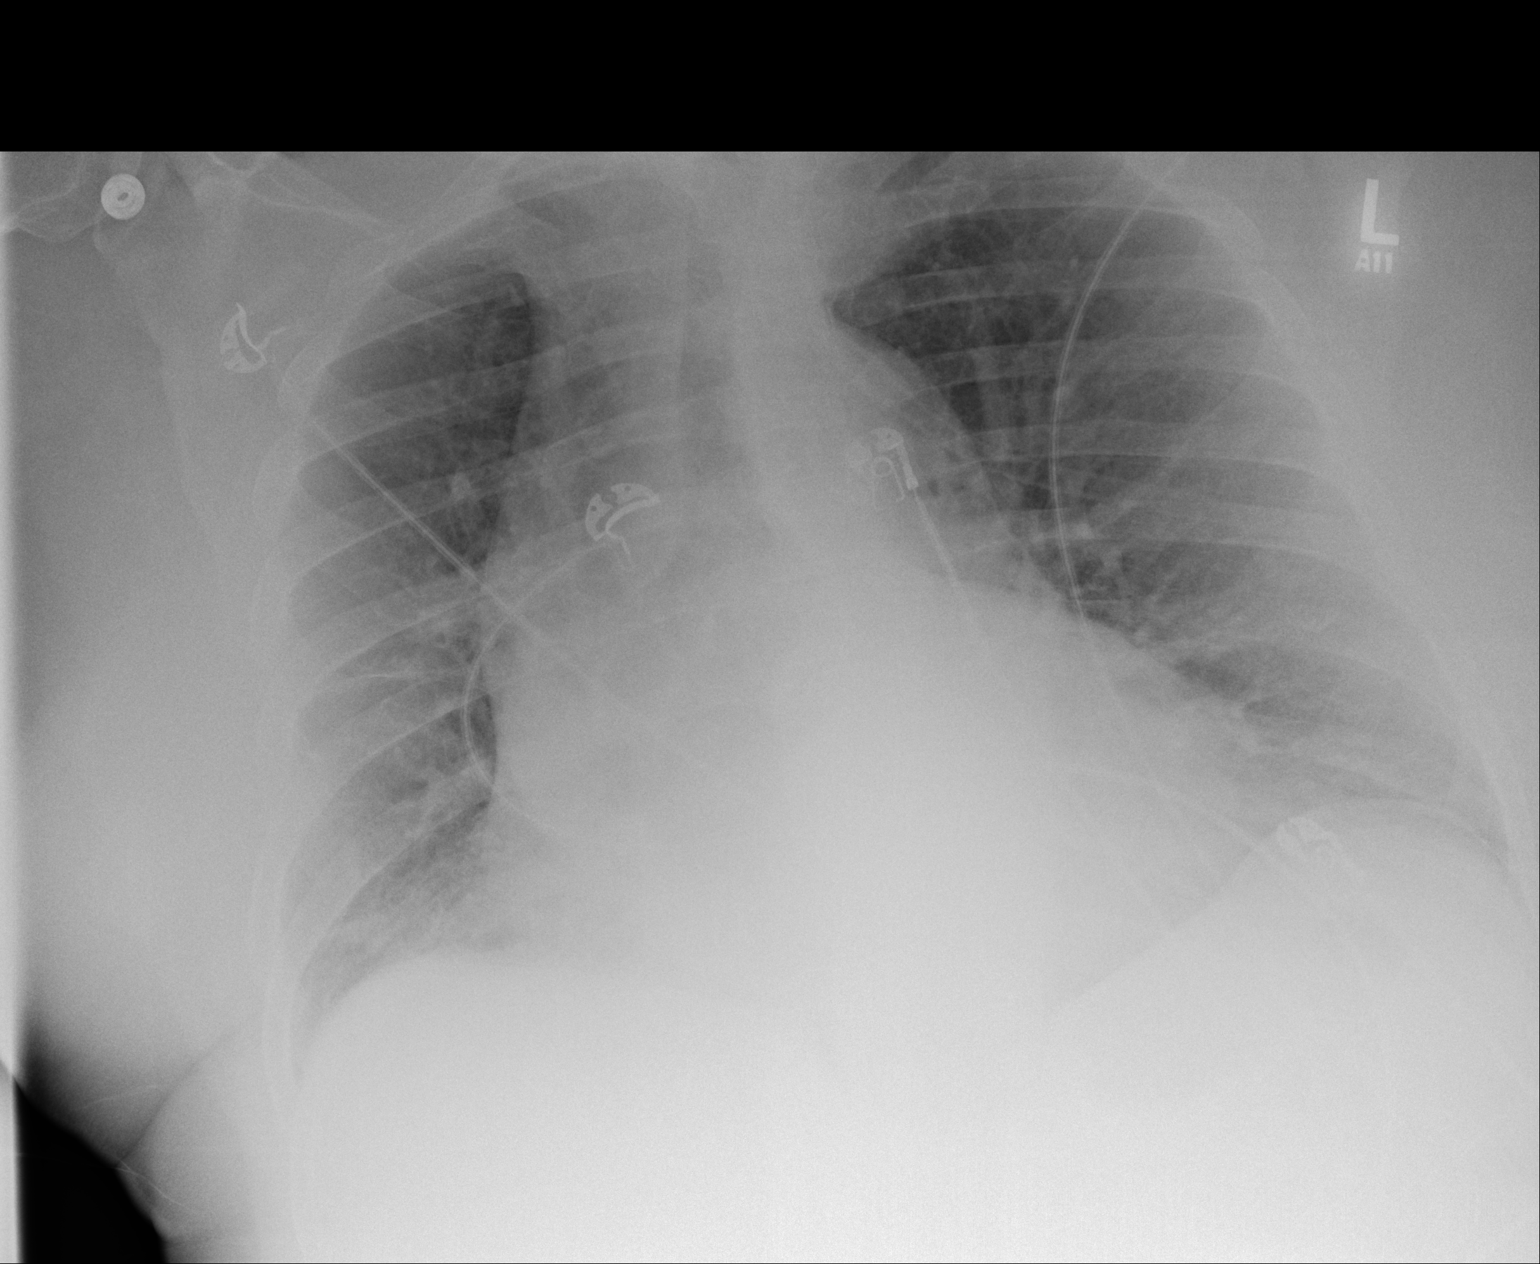

[1 of 1 positions shown; findings below may reference images not displayed]

FINDINGS: There is cardiomegaly without edema. No consolidative process,
pneumothorax or effusion.
IMPRESSION: Cardiomegaly without acute disease.

## 2018-10-14 IMAGING — CT CT ANGIO CHEST
2 of 6 series · 18 of 36 positions shown · IV contrast (Isovue)
Comparison: Chest radiograph performed earlier today at [DATE] p.m.

CLINICAL DATA: Acute onset of worsening shortness of breath.
Initial encounter.

EXAM:
CT ANGIOGRAPHY CHEST WITH CONTRAST
TECHNIQUE: Multidetector CT imaging of the chest was performed using the
standard protocol during bolus administration of intravenous
contrast. Multiplanar CT image reconstructions and MIPs were
obtained to evaluate the vascular anatomy.
CONTRAST:  100 mL of Isovue 370 IV contrast

[Series 5: thins · axial · 0.98mm/px · z∈[-283,-22]mm · 17 of 291 slices shown]
[im 15/291  lung]
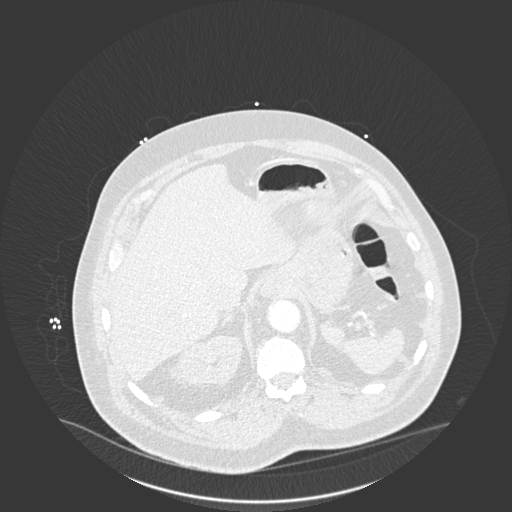
[im 30/291  mediastinal]
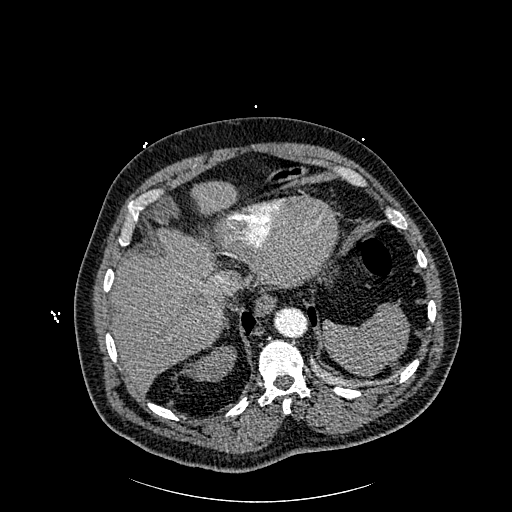
[im 44/291  lung]
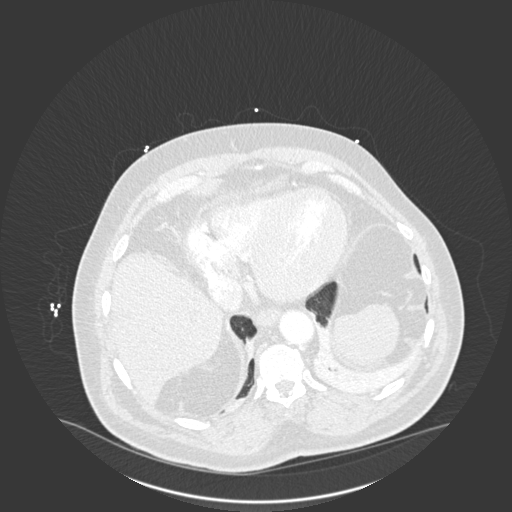
[im 59/291  mediastinal]
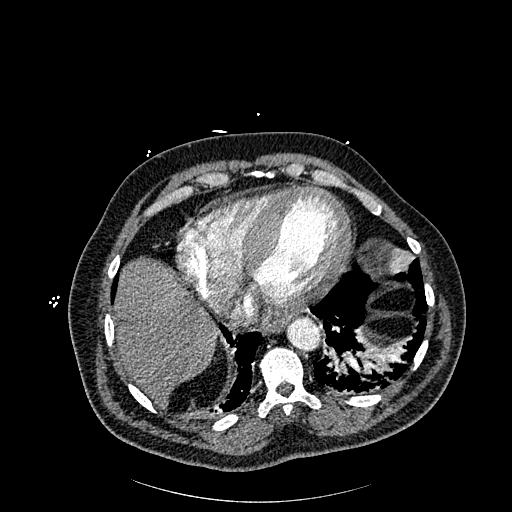
[im 88/291  lung]
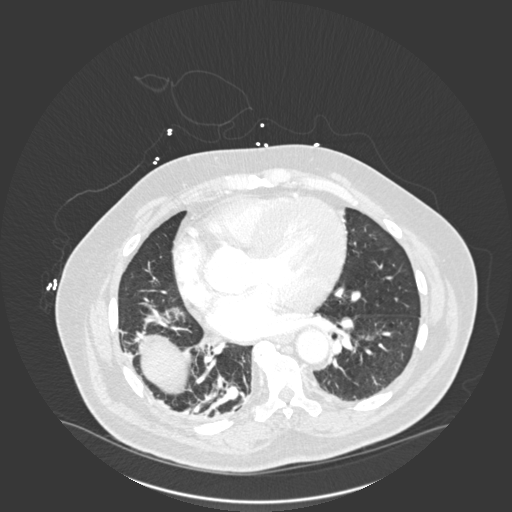
[im 102/291  mediastinal]
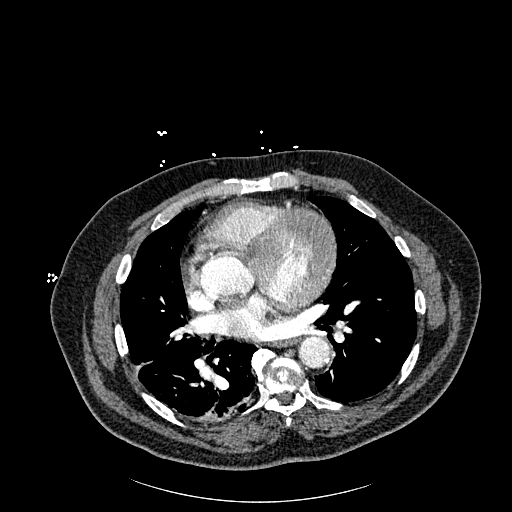
[im 117/291  lung]
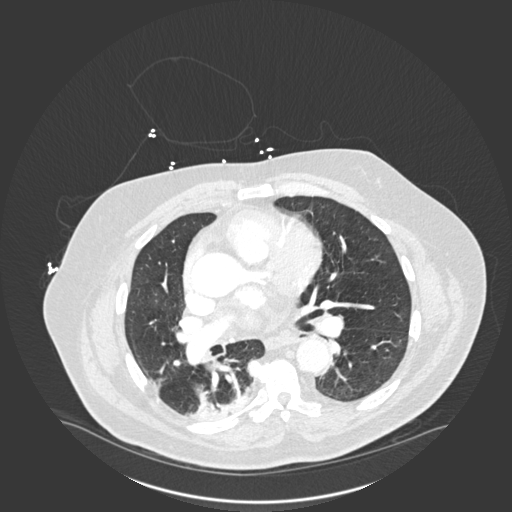
[im 131/291  mediastinal]
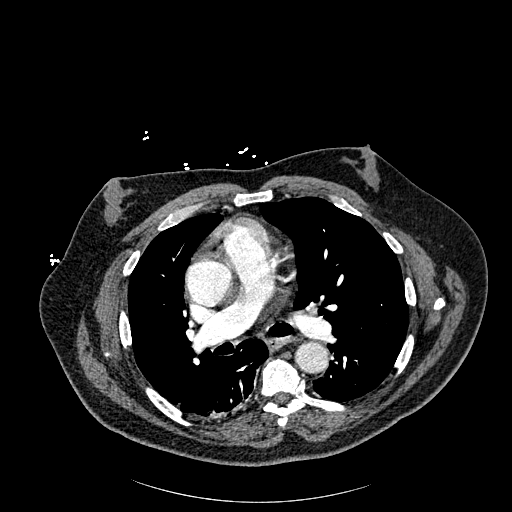
[im 146/291  lung]
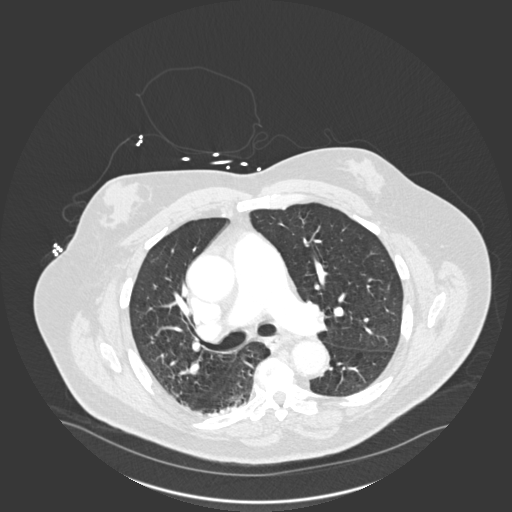
[im 160/291  mediastinal]
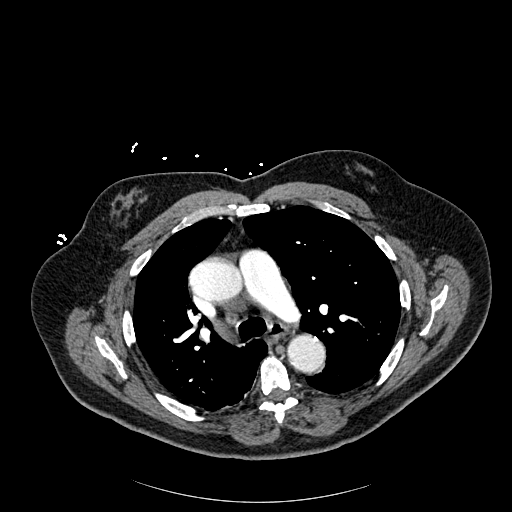
[im 175/291  lung]
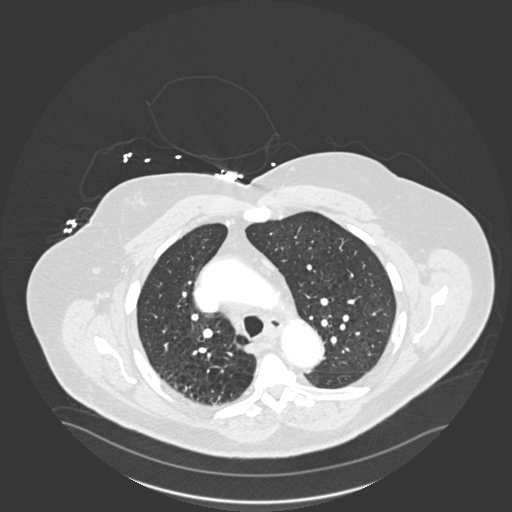
[im 189/291  mediastinal]
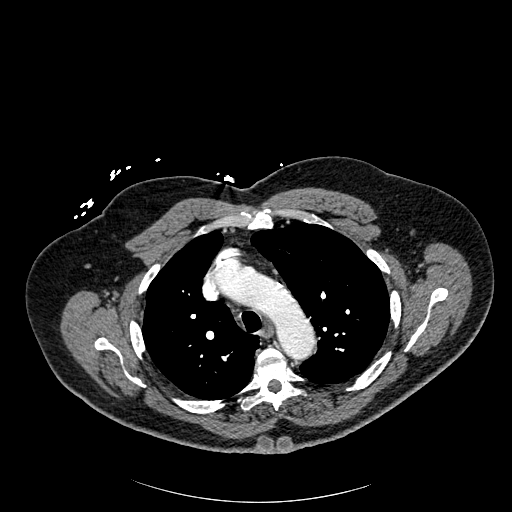
[im 204/291  lung]
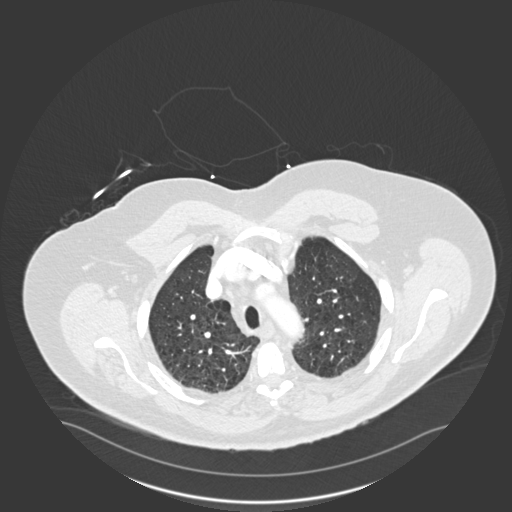
[im 233/291  mediastinal]
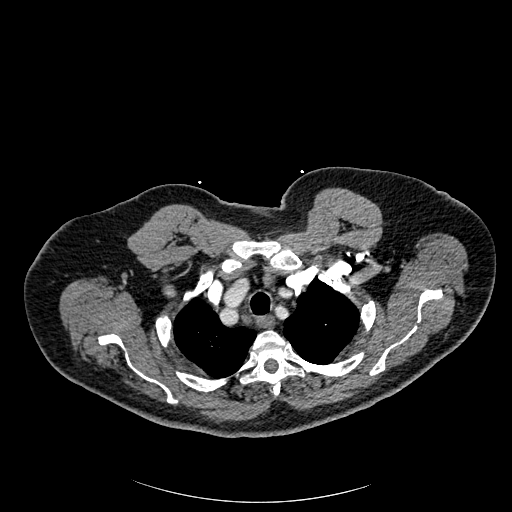
[im 247/291  lung]
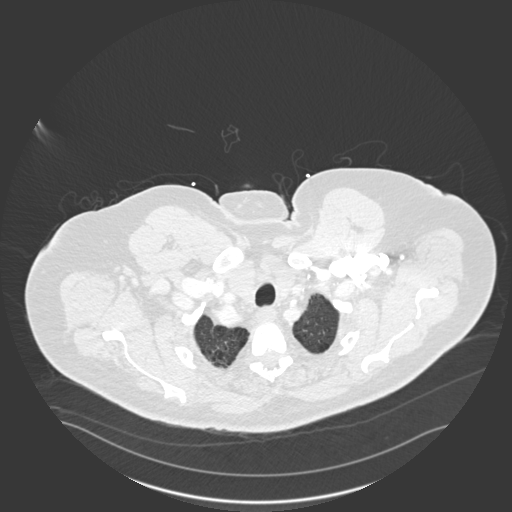
[im 262/291  mediastinal]
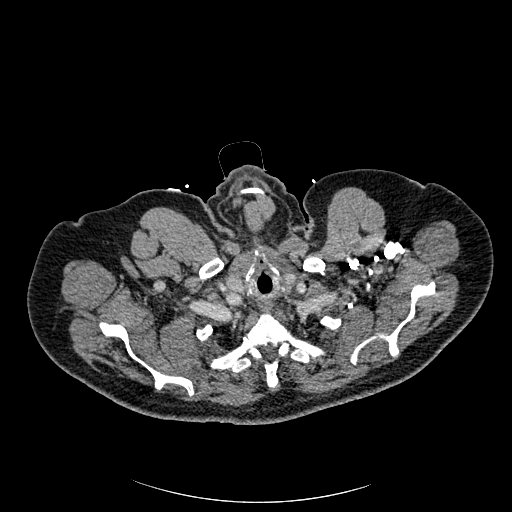
[im 276/291  lung]
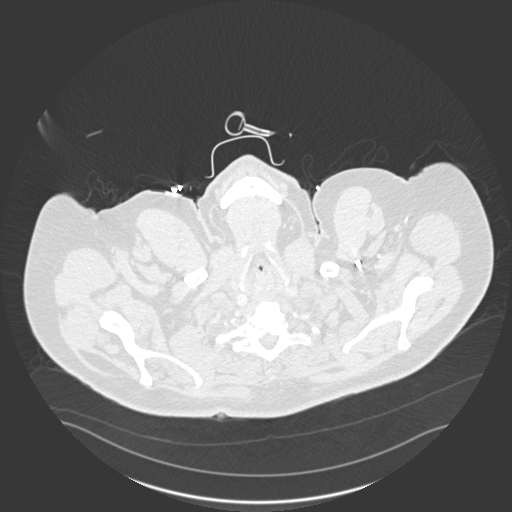

[Series 7: coronal mpr · coronal · 0.64mm/px · 1 of 164 slices shown]
[im 82/164  mediastinal]
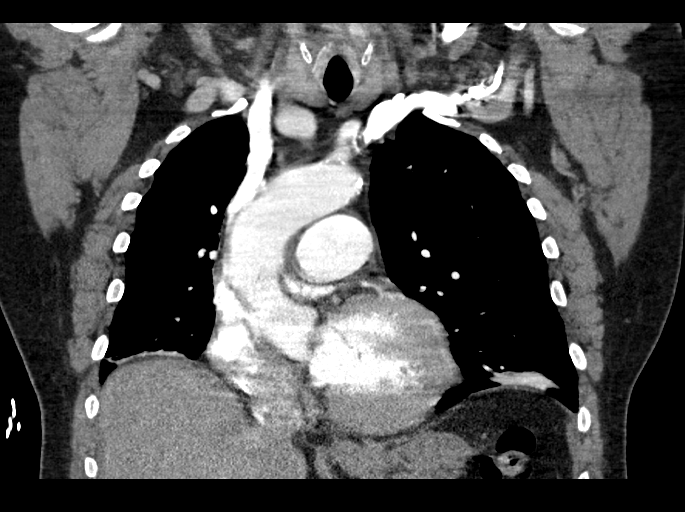

[18 of 36 positions shown; findings below may reference images not displayed]

FINDINGS: Cardiovascular:  There is no evidence of pulmonary embolus.

There is aneurysmal dilatation of the ascending thoracic aorta to
4.6 cm in AP dimension. Mild calcification is seen along the aortic
arch and descending thoracic aorta. The great vessels are grossly
unremarkable in appearance.

Mediastinum/Nodes: Trace pericardial fluid remains within normal
limits. Visualized mediastinal nodes remain normal in size. The
thyroid gland is unremarkable. No axillary lymphadenopathy is seen.

Mild bilateral gynecomastia is noted.

Lungs/Pleura: Bibasilar airspace opacities raise concern for
pneumonia. Mild bilateral emphysema is noted. No pleural effusion or
pneumothorax is seen. No dominant mass is identified.

Upper Abdomen: The visualized portions of the liver and the spleen
are grossly unremarkable. The visualized portions of the right
adrenal gland and right kidney are grossly unremarkable. Mild
right-sided perinephric stranding is noted.

Musculoskeletal: No acute osseous abnormalities are identified. The
visualized musculature is unremarkable in appearance.

Two small subcutaneous cystic foci are noted along the mid back at
the level of T5, each measuring 1.5 cm.

Review of the MIP images confirms the above findings.
IMPRESSION: 1. No evidence of pulmonary embolus.
2. Bibasilar airspace opacities raise concern for pneumonia.
3. Mild bilateral emphysema noted.
4. Aneurysmal dilatation of the ascending thoracic aorta to 4.6 cm
in AP dimension. Recommend semi-annual imaging followup by CTA or
MRA and referral to cardiothoracic surgery if not already obtained.
This recommendation follows 3444
ACCF/AHA/AATS/ACR/ASA/SCA/FLARANCE/WILDER ENRIQUE/SAKYIAMAH/BRASS Guidelines for the
Diagnosis and Management of Patients With Thoracic Aortic Disease.
Circulation. 3444; 121: [id number]
5. Mild bilateral gynecomastia noted.
6. Two small subcutaneous cystic foci noted along the mid back at
the level of T5, each measuring 1.5 cm. These are nonspecific. Would
correlate for any associated skin findings.
